# Patient Record
Sex: Female | Born: 1995 | Race: Black or African American | Hispanic: No | Marital: Single | Smoking: Current every day smoker
Health system: Southern US, Community
[De-identification: ages and names within clinical notes are randomized; demographics above are authoritative.]

## PROBLEM LIST (undated history)

## (undated) DIAGNOSIS — D649 Anemia, unspecified: Secondary | ICD-10-CM

## (undated) DIAGNOSIS — O139 Gestational [pregnancy-induced] hypertension without significant proteinuria, unspecified trimester: Secondary | ICD-10-CM

## (undated) DIAGNOSIS — E669 Obesity, unspecified: Secondary | ICD-10-CM

## (undated) DIAGNOSIS — K660 Peritoneal adhesions (postprocedural) (postinfection): Secondary | ICD-10-CM

## (undated) HISTORY — DX: Obesity, unspecified: E66.9

---

## 2013-10-09 DIAGNOSIS — O34219 Maternal care for unspecified type scar from previous cesarean delivery: Secondary | ICD-10-CM | POA: Insufficient documentation

## 2022-04-28 ENCOUNTER — Encounter (HOSPITAL_COMMUNITY): Payer: Self-pay | Admitting: Emergency Medicine

## 2022-04-28 ENCOUNTER — Ambulatory Visit (HOSPITAL_COMMUNITY)
Admission: EM | Admit: 2022-04-28 | Discharge: 2022-04-28 | Disposition: A | Discharge status: Home / Self Care | Payer: Medicaid Other | Attending: Physician Assistant | Admitting: Physician Assistant

## 2022-04-28 ENCOUNTER — Inpatient Hospital Stay (HOSPITAL_COMMUNITY)
Admission: AD | Admit: 2022-04-28 | Discharge: 2022-04-29 | Disposition: A | Discharge status: Home / Self Care | Payer: Medicaid Other | Attending: Obstetrics and Gynecology | Admitting: Obstetrics and Gynecology

## 2022-04-28 DIAGNOSIS — O3680X0 Pregnancy with inconclusive fetal viability, not applicable or unspecified: Secondary | ICD-10-CM | POA: Insufficient documentation

## 2022-04-28 DIAGNOSIS — Z3A01 Less than 8 weeks gestation of pregnancy: Secondary | ICD-10-CM

## 2022-04-28 DIAGNOSIS — M62830 Muscle spasm of back: Secondary | ICD-10-CM | POA: Diagnosis present

## 2022-04-28 DIAGNOSIS — O26891 Other specified pregnancy related conditions, first trimester: Secondary | ICD-10-CM | POA: Insufficient documentation

## 2022-04-28 DIAGNOSIS — S39012A Strain of muscle, fascia and tendon of lower back, initial encounter: Secondary | ICD-10-CM | POA: Diagnosis present

## 2022-04-28 DIAGNOSIS — M545 Low back pain, unspecified: Secondary | ICD-10-CM | POA: Diagnosis present

## 2022-04-28 DIAGNOSIS — M549 Dorsalgia, unspecified: Secondary | ICD-10-CM

## 2022-04-28 DIAGNOSIS — Z3A08 8 weeks gestation of pregnancy: Secondary | ICD-10-CM | POA: Insufficient documentation

## 2022-04-28 DIAGNOSIS — M546 Pain in thoracic spine: Secondary | ICD-10-CM | POA: Insufficient documentation

## 2022-04-28 DIAGNOSIS — Z6740 Type O blood, Rh positive: Secondary | ICD-10-CM | POA: Insufficient documentation

## 2022-04-28 DIAGNOSIS — R350 Frequency of micturition: Secondary | ICD-10-CM | POA: Diagnosis not present

## 2022-04-28 LAB — POCT URINALYSIS DIPSTICK, ED / UC
Bilirubin Urine: NEGATIVE
Glucose, UA: NEGATIVE mg/dL
Hgb urine dipstick: NEGATIVE
Ketones, ur: NEGATIVE mg/dL
Leukocytes,Ua: NEGATIVE
Nitrite: NEGATIVE
Protein, ur: NEGATIVE mg/dL
Specific Gravity, Urine: 1.015 (ref 1.005–1.030)
Urobilinogen, UA: 1 mg/dL (ref 0.0–1.0)
pH: 6.5 (ref 5.0–8.0)

## 2022-04-28 LAB — POC URINE PREG, ED: Preg Test, Ur: POSITIVE — AB

## 2022-04-28 MED ORDER — TIZANIDINE HCL 4 MG PO TABS: 4.0000 mg | ORAL_TABLET | Freq: Four times a day (QID) | ORAL | 0 refills | Status: DC | PRN

## 2022-04-28 MED ORDER — IBUPROFEN 800 MG PO TABS: 800.0000 mg | ORAL_TABLET | Freq: Three times a day (TID) | ORAL | 0 refills | Status: DC

## 2022-04-28 NOTE — Discharge Instructions (Addendum)
Advised to use ice therapy, 10 minutes on and 20 minutes off, 4-5 times a day to help reduce the pain in the lower back. Advised to take the ibuprofen 800 mg 1 every 8 hours with food to help reduce the pain Advised to take the Zanaflex 1 every 6-8 hours as needed for muscle spasm of the back. Follow-up PCP or return to urgent care if symptoms fail to improve.

## 2022-04-28 NOTE — ED Provider Notes (Signed)
[facility]    CSN: [id number] Arrival date & time: 04/28/22  1309      History   Chief Complaint Chief Complaint  Patient presents with   Back Pain    HPI Taylor Velazquez is a 26 y.o. female.   26 year old female presents with lower back pain.  Patient relates that she works at a [employer], she is on production.  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  Patient relates that after she started having this particular position she started having some lower back pain, it has been localized, worse with bending reaching and turning.  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  Patient relates she is also had frequency, but no dysuria, no hematuria.  Patient relates she has not taken any medication to get relief from the lower back pain.  Patient relates the pain does not travel down her legs, and she is not having weakness or numbness or tingling of the lower extremities. Patient requested a pregnancy test today, this is positive.  Patient has been counseled about taking the minimal amount of ibuprofen as possible to help give her relief, and to use more ice therapy.   Back Pain  History reviewed. No pertinent past medical history.  There are no problems to display for this patient.   Past Surgical History:  Procedure Laterality Date   CESAREAN SECTION  2014   Patient had another in 2015, 2016,2017,2019    OB History   No obstetric history on file.      Home Medications    Prior to Admission medications   Medication Sig Start Date End Date Taking? Authorizing Provider  diphenhydramine-acetaminophen (TYLENOL PM) 25-500 MG TABS tablet Take 1 tablet by mouth at bedtime as needed.   Yes [provider]  ibuprofen  (ADVIL) 800 MG tablet Take 1 tablet (800 mg total) by mouth 3 (three) times daily. 04/28/22  Yes Ellsworth LennoxJames, Tori Cupps, PA-C  tiZANidine (ZANAFLEX) 4 MG tablet Take 1 tablet (4 mg total) by mouth every 6 (six) hours as needed for muscle spasms. 04/28/22  Yes Ellsworth LennoxJames, Raeleigh Guinn, PA-C    Family History No family history on file.  Social History Social History   Tobacco Use   Smoking status: Never   Smokeless tobacco: Never  Vaping Use   Vaping Use: Never used  Substance Use Topics   Alcohol use: Never   Drug use: Never     Allergies   Patient has no known allergies.   Review of Systems Review of Systems  Genitourinary:  Positive for urgency.  Musculoskeletal:  Positive for back pain.    Physical Exam Triage Vital Signs ED Triage Vitals  Enc Vitals Group     BP 04/28/22 1427 124/81     Pulse Rate 04/28/22 1427 78     Resp 04/28/22 1427 16     Temp 04/28/22 1427 98.2 F (36.8 C)     Temp Source 04/28/22 1427 Oral     SpO2 04/28/22 1427 98 %     Weight 04/28/22 1431 236 lb (107 kg)     Height 04/28/22 1431 5\' 8"  (1.727 m)     Head Circumference --  Peak Flow --      Pain Score 04/28/22 1430 7     Pain Loc --      Pain Edu? --      Excl. in GC? --    No data found.  Updated Vital Signs BP 124/81 (BP Location: Right Arm)   Pulse 78   Temp 98.2 F (36.8 C) (Oral)   Resp 16   Ht 5\' 8"  (1.727 m)   Wt 236 lb (107 kg)   LMP 03/24/2022 (Approximate)   SpO2 98%   BMI 35.88 kg/m   Visual Acuity Right Eye Distance:   Left Eye Distance:   Bilateral Distance:    Right Eye Near:   Left Eye Near:    Bilateral Near:     Physical Exam Constitutional:      Appearance: Normal appearance.  Abdominal:     General: Abdomen is flat. Bowel sounds are normal.     Palpations: Abdomen is soft.     Tenderness: There is no abdominal tenderness.     Comments: Back: Tenderness palpated at the L5-S1 area, no swelling, negative straight leg raise bilaterally, strength is intact  bilaterally.  Range of motion is normal without restriction.  Neurological:     Mental Status: She is alert.     UC Treatments / Results  Labs (all labs ordered are listed, but only abnormal results are displayed) Labs Reviewed  POC URINE PREG, ED - Abnormal; Notable for the following components:      Result Value   Preg Test, Ur POSITIVE (*)    All other components within normal limits  URINE CULTURE  POCT URINALYSIS DIPSTICK, ED / UC    EKG   Radiology No results found.  Procedures Procedures (including critical care time)  Medications Ordered in UC Medications - No data to display  Initial Impression / Assessment and Plan / UC Course  I have reviewed the triage vital signs and the nursing notes.  Pertinent labs & imaging results that were available during my care of the patient were reviewed by me and considered in my medical decision making (see chart for details).    Plan: 1.  Patient advised to use ice therapy to help reduce the pain to the back, 10 minutes on 20 minutes off, 3-4 times a day. 2.  Patient advised to take minimal amount of ibuprofen to help reduce the pain of the lower back. 3.  Patient advised to follow-up with PCP if symptoms fail to improve. 4.  Patient advised to follow-up with her women's health provider since her pregnancy test was positive today. Final Clinical Impressions(s) / UC Diagnoses   Final diagnoses:  Acute midline low back pain without sciatica  Strain of lumbar region, initial encounter  Muscle spasm of back  Frequency of urination     Discharge Instructions      Advised to use ice therapy, 10 minutes on and 20 minutes off, 4-5 times a day to help reduce the pain in the lower back. Advised to take the ibuprofen 800 mg 1 every 8 hours with food to help reduce the pain Advised to take the Zanaflex 1 every 6-8 hours as needed for muscle spasm of the back. Follow-up PCP or return to urgent care if symptoms fail to  improve.    ED Prescriptions     Medication Sig Dispense Auth. Provider   ibuprofen (ADVIL) 800 MG tablet Take 1 tablet (800 mg total) by mouth 3 (three) times daily. 21 tablet  Ellsworth Lennox, PA-C   tiZANidine (ZANAFLEX) 4 MG tablet Take 1 tablet (4 mg total) by mouth every 6 (six) hours as needed for muscle spasms. 30 tablet Ellsworth Lennox, PA-C      PDMP not reviewed this encounter.   Ellsworth Lennox, PA-C 04/28/22 1504

## 2022-04-28 NOTE — ED Triage Notes (Signed)
Patient c/o LFT lower back pain and urinary frequency x 1 week.   Patient denies fall or trauma.   Patient denies dysuria or ABD pain.   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   Patient endorses pain occurs randomly.   Patient hasn't taken any medications for symptoms.

## 2022-04-29 ENCOUNTER — Inpatient Hospital Stay (HOSPITAL_COMMUNITY): Payer: Medicaid Other

## 2022-04-29 ENCOUNTER — Encounter (HOSPITAL_COMMUNITY): Payer: Self-pay | Admitting: *Deleted

## 2022-04-29 DIAGNOSIS — M549 Dorsalgia, unspecified: Secondary | ICD-10-CM

## 2022-04-29 DIAGNOSIS — O99891 Other specified diseases and conditions complicating pregnancy: Secondary | ICD-10-CM | POA: Diagnosis not present

## 2022-04-29 DIAGNOSIS — M62830 Muscle spasm of back: Secondary | ICD-10-CM | POA: Diagnosis not present

## 2022-04-29 DIAGNOSIS — Z3A01 Less than 8 weeks gestation of pregnancy: Secondary | ICD-10-CM

## 2022-04-29 DIAGNOSIS — M545 Low back pain, unspecified: Secondary | ICD-10-CM | POA: Diagnosis not present

## 2022-04-29 DIAGNOSIS — M546 Pain in thoracic spine: Secondary | ICD-10-CM | POA: Diagnosis not present

## 2022-04-29 DIAGNOSIS — O3680X0 Pregnancy with inconclusive fetal viability, not applicable or unspecified: Secondary | ICD-10-CM | POA: Diagnosis not present

## 2022-04-29 DIAGNOSIS — Z6740 Type O blood, Rh positive: Secondary | ICD-10-CM | POA: Diagnosis not present

## 2022-04-29 DIAGNOSIS — Z3A08 8 weeks gestation of pregnancy: Secondary | ICD-10-CM | POA: Diagnosis not present

## 2022-04-29 DIAGNOSIS — O26891 Other specified pregnancy related conditions, first trimester: Secondary | ICD-10-CM | POA: Diagnosis not present

## 2022-04-29 LAB — URINE CULTURE: Culture: <10000 — AB

## 2022-04-29 LAB — ABO/RH: ABO/RH(D): O POS

## 2022-04-29 LAB — URINALYSIS, ROUTINE W REFLEX MICROSCOPIC
Bacteria, UA: NONE SEEN
Bilirubin Urine: NEGATIVE
Glucose, UA: NEGATIVE mg/dL
Hgb urine dipstick: NEGATIVE
Ketones, ur: 80 mg/dL — AB
Leukocytes,Ua: NEGATIVE
Nitrite: NEGATIVE
Protein, ur: NEGATIVE mg/dL
Specific Gravity, Urine: 1.027 (ref 1.005–1.030)
pH: 6 (ref 5.0–8.0)

## 2022-04-29 LAB — WET PREP, GENITAL
Clue Cells Wet Prep HPF POC: NONE SEEN
Sperm: NONE SEEN
Trich, Wet Prep: NONE SEEN
WBC, Wet Prep HPF POC: <10 (ref <10)
Yeast Wet Prep HPF POC: NONE SEEN

## 2022-04-29 LAB — GC/CHLAMYDIA PROBE AMP (~~LOC~~) NOT AT ARMC
Chlamydia: NEGATIVE
Comment: NEGATIVE
Comment: NORMAL
Neisseria Gonorrhea: NEGATIVE

## 2022-04-29 LAB — CBC
HCT: 35.1 % — ABNORMAL LOW (ref 36.0–46.0)
Hemoglobin: 11.4 g/dL — ABNORMAL LOW (ref 12.0–15.0)
MCH: 27.3 pg (ref 26.0–34.0)
MCHC: 32.5 g/dL (ref 30.0–36.0)
MCV: 84.2 fL (ref 80.0–100.0)
Platelets: 170 10*3/uL (ref 150–400)
RBC: 4.17 MIL/uL (ref 3.87–5.11)
RDW: 13.7 % (ref 11.5–15.5)
WBC: 9.6 10*3/uL (ref 4.0–10.5)
nRBC: 0 % (ref 0.0–0.2)

## 2022-04-29 LAB — HCG, QUANTITATIVE, PREGNANCY: hCG, Beta Chain, Quant, S: 1236 m[IU]/mL — ABNORMAL HIGH (ref <5)

## 2022-04-29 MED ORDER — CYCLOBENZAPRINE HCL 10 MG PO TABS: 10.0000 mg | ORAL_TABLET | Freq: Two times a day (BID) | ORAL | 0 refills | Status: DC | PRN

## 2022-04-29 MED ORDER — CYCLOBENZAPRINE HCL 5 MG PO TABS
10.0000 mg | ORAL_TABLET | Freq: Once | ORAL | Status: AC
  Administered 2022-04-29: 10 mg via ORAL
  Filled 2022-04-29: qty 2

## 2022-04-29 NOTE — MAU Provider Note (Addendum)
Chief Complaint: No chief complaint on file.   Event Date/Time   First Provider Initiated Contact with Patient 04/29/22 0215        SUBJECTIVE HPI: Taylor Velazquez is a 26 y.o. G6P5006 at 4461w1d by LMP who presents to maternity admissions reporting recurrent pain in midback and between shoulders.  Was told to take ibuprofen but it does'nt help much.  Also is early pregnant and was told she needed evaluation.. She denies vaginal bleeding, vaginal itching/burning, urinary symptoms, h/a, dizziness, n/v, or fever/chills.    Back Pain This is a new problem. The current episode started in the past 7 days. The problem occurs constantly. The problem is unchanged. The pain is present in the thoracic spine and lumbar spine. The quality of the pain is described as aching and cramping. The pain does not radiate. The pain is moderate. The pain is The same all the time. The symptoms are aggravated by bending, standing and twisting. Stiffness is present All day. Pertinent negatives include no abdominal pain, dysuria, fever, headaches or numbness. She has tried analgesics for the symptoms. The treatment provided mild relief.   No past medical history on file. Past Surgical History:  Procedure Laterality Date   CESAREAN SECTION  2014   Patient had another in 2015, 2016,2017,2019   Social History   Socioeconomic History   Marital status: Single    Spouse name: Not on file   Number of children: Not on file   Years of education: Not on file   Highest education level: Not on file  Occupational History   Not on file  Tobacco Use   Smoking status: Never   Smokeless tobacco: Never  Vaping Use   Vaping Use: Never used  Substance and Sexual Activity   Alcohol use: Never   Drug use: Never   Sexual activity: Not on file  Other Topics Concern   Not on file  Social History Narrative   Not on file   Social Determinants of Health   Financial Resource Strain: Not on file  Food Insecurity: Not on file   Transportation Needs: Not on file  Physical Activity: Not on file  Stress: Not on file  Social Connections: Not on file  Intimate Partner Violence: Not on file   No current facility-administered medications on file prior to encounter.   Current Outpatient Medications on File Prior to Encounter  Medication Sig Dispense Refill   diphenhydramine-acetaminophen (TYLENOL PM) 25-500 MG TABS tablet Take 1 tablet by mouth at bedtime as needed.     ibuprofen (ADVIL) 800 MG tablet Take 1 tablet (800 mg total) by mouth 3 (three) times daily. 21 tablet 0   tiZANidine (ZANAFLEX) 4 MG tablet Take 1 tablet (4 mg total) by mouth every 6 (six) hours as needed for muscle spasms. 30 tablet 0   No Known Allergies  I have reviewed patient's Past Medical Hx, Surgical Hx, Family Hx, Social Hx, medications and allergies.   ROS:  Review of Systems  Constitutional:  Negative for fever.  Gastrointestinal:  Negative for abdominal pain.  Genitourinary:  Negative for dysuria.  Musculoskeletal:  Positive for back pain.  Neurological:  Negative for numbness and headaches.  Review of Systems  Other systems negative   Physical Exam  Physical Exam Patient Vitals for the past 24 hrs:  BP Temp Temp src Pulse Resp Height Weight  04/29/22 0144 110/74 (!) 97.5 F (36.4 C) Oral 84 20 5\' 8"  (1.727 m) 108.5 kg   Constitutional: Well-developed, well-nourished female  in no acute distress.  Cardiovascular: normal rate Respiratory: normal effort GI: Abd soft, non-tender.  MS: Extremities nontender, no edema, normal ROM   Tender over lumbar spin and between shoulders Neurologic: Alert and oriented x 4.  GU: Neg CVAT.  PELVIC EXAM: deferred in lieu of transvaginal ultrasound  LAB RESULTS Results for orders placed or performed during the hospital encounter of 04/28/22 (from the past 24 hour(s))  Urinalysis, Routine w reflex microscopic     Status: Abnormal   Collection Time: 04/29/22  1:18 AM  Result Value Ref  Range   Color, Urine YELLOW YELLOW   APPearance CLEAR CLEAR   Specific Gravity, Urine 1.027 1.005 - 1.030   pH 6.0 5.0 - 8.0   Glucose, UA NEGATIVE NEGATIVE mg/dL   Hgb urine dipstick NEGATIVE NEGATIVE   Bilirubin Urine NEGATIVE NEGATIVE   Ketones, ur 80 (A) NEGATIVE mg/dL   Protein, ur NEGATIVE NEGATIVE mg/dL   Nitrite NEGATIVE NEGATIVE   Leukocytes,Ua NEGATIVE NEGATIVE   RBC / HPF 0-5 0 - 5 RBC/hpf   WBC, UA 0-5 0 - 5 WBC/hpf   Bacteria, UA NONE SEEN NONE SEEN   Squamous Epithelial / LPF 0-5 0 - 5   Mucus PRESENT   CBC     Status: Abnormal   Collection Time: 04/29/22  1:21 AM  Result Value Ref Range   WBC 9.6 4.0 - 10.5 K/uL   RBC 4.17 3.87 - 5.11 MIL/uL   Hemoglobin 11.4 (L) 12.0 - 15.0 g/dL   HCT 92.1 (L) 19.4 - 17.4 %   MCV 84.2 80.0 - 100.0 fL   MCH 27.3 26.0 - 34.0 pg   MCHC 32.5 30.0 - 36.0 g/dL   RDW 08.1 44.8 - 18.5 %   Platelets 170 150 - 400 K/uL   nRBC 0.0 0.0 - 0.2 %  hCG, quantitative, pregnancy     Status: Abnormal   Collection Time: 04/29/22  1:21 AM  Result Value Ref Range   hCG, Beta Chain, Quant, S 1,236 (H) <5 mIU/mL  ABO/Rh     Status: None   Collection Time: 04/29/22  1:21 AM  Result Value Ref Range   ABO/RH(D) O POS    No rh immune globuloin      NOT A RH IMMUNE GLOBULIN CANDIDATE, PT RH POSITIVE Performed at [facility], [address] [place], [place] [address]   Wet prep, genital     Status: None   Collection Time: 04/29/22  1:50 AM  Result Value Ref Range   Yeast Wet Prep HPF POC NONE SEEN NONE SEEN   Trich, Wet Prep NONE SEEN NONE SEEN   Clue Cells Wet Prep HPF POC NONE SEEN NONE SEEN   WBC, Wet Prep HPF POC <10 <10   Sperm NONE SEEN      --/--/O POS (06/02 0121)  IMAGING US OB LESS THAN 14 WEEKS WITH OB TRANSVAGINAL  Result Date: 04/29/2022 CLINICAL DATA:  Pain. EXAM: OBSTETRIC <14 WK [place] AND TRANSVAGINAL OB US TECHNIQUE: Both transabdominal and transvaginal ultrasound examinations were performed for complete evaluation of  the gestation as well as the maternal uterus, adnexal regions, and pelvic cul-de-sac. Transvaginal technique was performed to assess early pregnancy. COMPARISON:  None Available. FINDINGS: Intrauterine gestational sac: None Endometrium: Not well evaluated or visualized secondary to artifact. Right ovary: Within normal limits measuring 2.0 by 2.8 x 1.4 cm. Left ovary: Measures 3.7 x 2.2 by 2.0 cm. Likely corpus luteum noted in the left ovary measuring 2.1 x  2.2 by 1.7 cm. Other: Trace free fluid in the pelvis. IMPRESSION: 1. No intrauterine gestational sac identified. Endometrium is not well visualized on this study. No adnexal masses. Likely corpus luteum in the left ovary. In the setting of a positive pregnancy test, findings may be related to early normal IUP, completed abortion/abortion in progress or occult ectopic pregnancy. Recommend correlation with serial beta HCG. Electronically Signed   By: Darliss Cheney M.D.   On: 04/29/2022 02:50     MAU Management/MDM: I have reviewed the triage vital signs and the nursing notes.   Pertinent labs & imaging results that were available during my care of the patient were reviewed by me and considered in my medical decision making (see chart for details).      I have reviewed her medical records including past results, notes and treatments.   Ordered usual first trimester r/o ectopic labs.   Pelvic cultures done Will check baseline Ultrasound to rule out ectopic.  Treatments in MAU included Flexeril which completely alleviated pain.   This bleeding/pain can represent a normal pregnancy with bleeding, spontaneous abortion or even an ectopic which can be life-threatening.  The process as listed above helps to determine which of these is present.    ASSESSMENT Pregnancy at [redacted]w[redacted]d by LMP Back pain due to spasm Pregnancy of unknown location  PLAN Discharge home Rx Flexeril for back spasm Plan to rpeat HCG on Sunday morning Plan to repeat Ultrasound in  about 7-10 days if HCG levels double appropriately  Ectopic precautions   Pt stable at time of discharge. Encouraged to return here if she develops worsening of symptoms, increase in pain, fever, or other concerning symptoms.    Wynelle Bourgeois CNM, MSN Certified Nurse-Midwife 04/29/2022  2:16 AM

## 2022-04-29 NOTE — MAU Note (Signed)
Pt says her back hurts - started 04-16-2022- took XS Tyl- helped some  Has continued . Went to Urgent Care bc- had sharp pain in her back- found out preg. Had  sex- 3 days ago

## 2022-05-01 ENCOUNTER — Inpatient Hospital Stay (HOSPITAL_COMMUNITY)
Admission: AD | Admit: 2022-05-01 | Discharge: 2022-05-01 | Disposition: A | Discharge status: Home / Self Care | Payer: Medicaid Other | Attending: Obstetrics and Gynecology | Admitting: Obstetrics and Gynecology

## 2022-05-01 ENCOUNTER — Other Ambulatory Visit: Payer: Self-pay

## 2022-05-01 ENCOUNTER — Ambulatory Visit (HOSPITAL_COMMUNITY)
Admit: 2022-05-01 | Discharge: 2022-05-01 | Disposition: A | Discharge status: Home / Self Care | Payer: Medicaid Other | Attending: Obstetrics and Gynecology | Admitting: Obstetrics and Gynecology

## 2022-05-01 DIAGNOSIS — Z3490 Encounter for supervision of normal pregnancy, unspecified, unspecified trimester: Secondary | ICD-10-CM | POA: Diagnosis not present

## 2022-05-01 DIAGNOSIS — Z3A01 Less than 8 weeks gestation of pregnancy: Secondary | ICD-10-CM

## 2022-05-01 DIAGNOSIS — Z3491 Encounter for supervision of normal pregnancy, unspecified, first trimester: Secondary | ICD-10-CM | POA: Insufficient documentation

## 2022-05-01 LAB — HCG, QUANTITATIVE, PREGNANCY: hCG, Beta Chain, Quant, S: 3449 m[IU]/mL — ABNORMAL HIGH (ref <5)

## 2022-05-01 NOTE — Discharge Instructions (Signed)
           [facility]  [address] [phone]

## 2022-05-01 NOTE — MAU Provider Note (Signed)
  S Ms. Zayden Hahne is a 26 y.o. [phone] patient who presents to MAU today for Beta Hcg follow up.    O BP 121/72 (BP Location: Right Arm)   Pulse 98   Temp 99.2 F (37.3 C) (Oral)   Resp 16   LMP 04/02/2022   SpO2 97% Comment: room air Physical Exam Vitals and nursing note reviewed.  Constitutional:      General: She is not in acute distress. HENT:     Head: Normocephalic.     Nose: Nose normal.  Pulmonary:     Effort: Pulmonary effort is normal. No respiratory distress.  Musculoskeletal:     Cervical back: Normal range of motion.  Neurological:     Mental Status: She is alert and oriented to person, place, and time.  Psychiatric:        Mood and Affect: Mood normal.        Component Ref Range & Units 08:10 2 d ago  hCG, Beta Chain, Quant, S <5 mIU/mL 3,449 High   1,236 High  CM       A Medical screening exam complete Beta Quant rising appropriately for 4 week early pregnancy.   P  Follow-up Information     [facility] Follow up in 2 day(s).   Specialty: Obstetrics and Gynecology Why: lab follow up Contact information: [address] [place] [address] [phone]              CNM scheduled 1 additional Quant Follow up in 3 days at [facility].  Patient Discharge from MAU in stable condition.  Address of office for follow-up given.  Warning signs for worsening condition that would warrant emergency follow-up discussed Patient may return to MAU as needed   Carlynn Herald, [place] 05/01/2022 10:42 AM

## 2022-05-01 NOTE — MAU Note (Signed)
Taylor Velazquez is a 26 y.o. at [redacted]w[redacted]d here in MAU reporting: here for follow up hcg. Denies pain and bleeding.  Onset of complaint: ongoing  Pain score: 0/10  Vitals:   05/01/22 0806  BP: 121/72  Pulse: 98  Resp: 16  Temp: 99.2 F (37.3 C)  SpO2: 97%     Lab orders placed from triage: hcg

## 2022-05-04 ENCOUNTER — Ambulatory Visit: Payer: Medicaid Other

## 2022-05-05 ENCOUNTER — Other Ambulatory Visit: Payer: Medicaid Other

## 2022-05-07 ENCOUNTER — Inpatient Hospital Stay (HOSPITAL_COMMUNITY)
Admission: AD | Admit: 2022-05-07 | Discharge: 2022-05-07 | Disposition: A | Discharge status: Home / Self Care | Payer: Medicaid Other | Attending: Obstetrics and Gynecology | Admitting: Obstetrics and Gynecology

## 2022-05-07 ENCOUNTER — Encounter (HOSPITAL_COMMUNITY): Payer: Self-pay | Admitting: Obstetrics and Gynecology

## 2022-05-07 DIAGNOSIS — O3680X0 Pregnancy with inconclusive fetal viability, not applicable or unspecified: Secondary | ICD-10-CM | POA: Diagnosis not present

## 2022-05-07 DIAGNOSIS — Z3A01 Less than 8 weeks gestation of pregnancy: Secondary | ICD-10-CM | POA: Diagnosis not present

## 2022-05-07 DIAGNOSIS — Z3201 Encounter for pregnancy test, result positive: Secondary | ICD-10-CM | POA: Diagnosis present

## 2022-05-07 HISTORY — DX: Gestational (pregnancy-induced) hypertension without significant proteinuria, unspecified trimester: O13.9

## 2022-05-07 LAB — HCG, QUANTITATIVE, PREGNANCY: hCG, Beta Chain, Quant, S: 20009 m[IU]/mL — ABNORMAL HIGH (ref <5)

## 2022-05-07 NOTE — MAU Note (Signed)
Judeth Horn NP in [facility] RM to see pt and discuss plan of care. Lab drawn and then pt d/c home. NP will call pt with results and further discuss plan of care depending on lab results. Pt agrees with plan and d/c ambulatory

## 2022-05-07 NOTE — MAU Provider Note (Signed)
History   Chief Complaint:  Follow-up   Taylor Velazquez is  26 y.o. [id number] Patient's last menstrual period was 04/02/2022.Taylor Velazquez Patient is here for follow up of quantitative HCG and ongoing surveillance of pregnancy status. She is [redacted]w[redacted]d weeks gestation  by LMP.   She was supposed to go to the office on 6/7 for repeat HCG but couldn't keep the appointment due to her work schedule.   Since her last visit, the patient is without new complaint. The patient reports bleeding as  none now.  She denies any pain.  General ROS:  negative  Her previous Quantitative HCG values are:  Component     Latest Ref Rng 04/29/2022 05/01/2022  HCG, Beta Chain, Quant, S     <5 mIU/mL 1,236 (H)  3,449 (H)        Physical Exam   Blood pressure 123/86, pulse 81, temperature 98.2 F (36.8 C), resp. rate 17, height 5\' 8"  (1.727 m), weight 109.3 kg, last menstrual period 04/02/2022, SpO2 100 %.  Physical Examination: General appearance - alert, well appearing, and in no distress Mental status - normal mood, behavior, speech, dress, motor activity, and thought processes Eyes - sclera anicteric Chest - normal respiratory effort  Labs: Results for orders placed or performed during the hospital encounter of 05/07/22 (from the past 24 hour(s))  hCG, quantitative, pregnancy   Collection Time: 05/07/22  5:20 AM  Result Value Ref Range   hCG, Beta Chain, Quant, S 20,009 (H) <5 mIU/mL    Ultrasound Studies:   No results found.  Assessment:   1. Pregnancy of unknown anatomic location   2. [redacted] weeks gestation of pregnancy     -HCG continues to rise. Currently no symptoms. Will schedule for f/u ultrasound.   Plan: -Discharge home in stable condition -SAB precautions discussed -Patient advised to follow-up with [facility] for ultrasound on 6/20 -Patient may return to MAU as needed or if her condition were to change or worsen  7/20, NP 05/07/2022, 9:18 AM

## 2022-05-07 NOTE — MAU Note (Addendum)
.  Taylor Velazquez is a 26 y.o. at 101w0d here in MAU for repeat BHCG. States she has just got off work so has difficulty making some appts. Denies any bleeding. Occ sharp pains in abdomen but no pain currently. LMP: 04/02/22 Onset of complaint: n/a Pain score: 0 Vitals:   05/07/22 0440 05/07/22 0441  BP:  123/86  Pulse: 81   Resp: 17   Temp: 98.2 F (36.8 C)   SpO2: 100%      FHT:n/a Lab orders placed from triage:

## 2022-05-07 NOTE — Discharge Instructions (Signed)
Return to care  If you have heavier bleeding that soaks through more than 2 pads per hour for an hour or more If you bleed so much that you feel like you might pass out or you do pass out If you have significant abdominal pain that is not improved with Tylenol   

## 2022-05-10 ENCOUNTER — Other Ambulatory Visit: Payer: Self-pay

## 2022-05-10 ENCOUNTER — Inpatient Hospital Stay (HOSPITAL_COMMUNITY)
Admission: AD | Admit: 2022-05-10 | Discharge: 2022-05-11 | Disposition: A | Discharge status: Home / Self Care | Payer: Medicaid Other | Attending: Obstetrics & Gynecology | Admitting: Obstetrics & Gynecology

## 2022-05-10 DIAGNOSIS — R109 Unspecified abdominal pain: Secondary | ICD-10-CM | POA: Insufficient documentation

## 2022-05-10 DIAGNOSIS — Z3491 Encounter for supervision of normal pregnancy, unspecified, first trimester: Secondary | ICD-10-CM

## 2022-05-10 DIAGNOSIS — O0941 Supervision of pregnancy with grand multiparity, first trimester: Secondary | ICD-10-CM | POA: Insufficient documentation

## 2022-05-10 DIAGNOSIS — O26891 Other specified pregnancy related conditions, first trimester: Secondary | ICD-10-CM

## 2022-05-10 DIAGNOSIS — O09291 Supervision of pregnancy with other poor reproductive or obstetric history, first trimester: Secondary | ICD-10-CM | POA: Insufficient documentation

## 2022-05-10 DIAGNOSIS — Z3A01 Less than 8 weeks gestation of pregnancy: Secondary | ICD-10-CM | POA: Insufficient documentation

## 2022-05-10 DIAGNOSIS — O26851 Spotting complicating pregnancy, first trimester: Secondary | ICD-10-CM | POA: Insufficient documentation

## 2022-05-10 LAB — URINALYSIS, ROUTINE W REFLEX MICROSCOPIC
Bilirubin Urine: NEGATIVE
Glucose, UA: NEGATIVE mg/dL
Hgb urine dipstick: NEGATIVE
Ketones, ur: NEGATIVE mg/dL
Leukocytes,Ua: NEGATIVE
Nitrite: NEGATIVE
Protein, ur: NEGATIVE mg/dL
Specific Gravity, Urine: 1.027 (ref 1.005–1.030)
pH: 5 (ref 5.0–8.0)

## 2022-05-10 NOTE — MAU Note (Signed)
.  Taylor Velazquez is a 26 y.o. at [redacted]w[redacted]d here in MAU reporting I feel dehydrated and weak but I drink a lot of water. Pink on tissue last night and today when I wiped. Having some cramping in lower abd. Having normal BMs.   Onset of complaint: yesterday Pain score: 6 Vitals:   05/10/22 2251 05/10/22 2253  BP:  113/86  Pulse: 82   Resp: 17   Temp: 98.6 F (37 C)   SpO2: 99%      FHT:na Lab orders placed from triage:   ua

## 2022-05-11 ENCOUNTER — Inpatient Hospital Stay (HOSPITAL_COMMUNITY): Payer: Medicaid Other

## 2022-05-11 DIAGNOSIS — O26851 Spotting complicating pregnancy, first trimester: Secondary | ICD-10-CM

## 2022-05-11 DIAGNOSIS — O09291 Supervision of pregnancy with other poor reproductive or obstetric history, first trimester: Secondary | ICD-10-CM | POA: Diagnosis not present

## 2022-05-11 DIAGNOSIS — R109 Unspecified abdominal pain: Secondary | ICD-10-CM

## 2022-05-11 DIAGNOSIS — O26891 Other specified pregnancy related conditions, first trimester: Secondary | ICD-10-CM

## 2022-05-11 DIAGNOSIS — Z3A01 Less than 8 weeks gestation of pregnancy: Secondary | ICD-10-CM | POA: Diagnosis not present

## 2022-05-11 DIAGNOSIS — Z3491 Encounter for supervision of normal pregnancy, unspecified, first trimester: Secondary | ICD-10-CM | POA: Diagnosis not present

## 2022-05-11 DIAGNOSIS — O0941 Supervision of pregnancy with grand multiparity, first trimester: Secondary | ICD-10-CM | POA: Diagnosis not present

## 2022-05-11 DIAGNOSIS — R1030 Lower abdominal pain, unspecified: Secondary | ICD-10-CM | POA: Diagnosis present

## 2022-05-11 NOTE — MAU Note (Signed)
Lisa Leftwich-Kirby CNM in Triage to see pt and discuss plan of care 

## 2022-05-11 NOTE — MAU Provider Note (Signed)
Chief Complaint: Abdominal Pain and Vaginal Bleeding   Event Date/Time   First Provider Initiated Contact with Patient 05/11/22 0146      SUBJECTIVE HPI: Taylor Velazquez is a 26 y.o. G6P5005 at [redacted]w[redacted]d by LMP who presents to maternity admissions reporting sharp intermittent lower abdominal pain and pink spotting x 2 days. She denies any other symptoms.   She initially presented to MAU on 04/29/22  with back pain and had appropriate hcg rise on 05/01/22, which was repeated on MAU visit 05/07/22 with continuing hcg rise.  She has outpatient [place] ordered on 05/17/22 for follow up.  HPI  Past Medical History:  Diagnosis Date   Gestational hypertension    Past Surgical History:  Procedure Laterality Date   CESAREAN SECTION  2014   Patient had another in 2015, 2016,2017,2019   Social History   Socioeconomic History   Marital status: Single    Spouse name: Not on file   Number of children: Not on file   Years of education: Not on file   Highest education level: Not on file  Occupational History   Not on file  Tobacco Use   Smoking status: Never   Smokeless tobacco: Never  Vaping Use   Vaping Use: Never used  Substance and Sexual Activity   Alcohol use: Never   Drug use: Never   Sexual activity: Not on file  Other Topics Concern   Not on file  Social History Narrative   Not on file   Social Determinants of Health   Financial Resource Strain: Not on file  Food Insecurity: Not on file  Transportation Needs: Not on file  Physical Activity: Not on file  Stress: Not on file  Social Connections: Not on file  Intimate Partner Violence: Not on file   No current facility-administered medications on file prior to encounter.   Current Outpatient Medications on File Prior to Encounter  Medication Sig Dispense Refill   cyclobenzaprine (FLEXERIL) 10 MG tablet Take 1 tablet (10 mg total) by mouth 2 (two) times daily as needed for muscle spasms. 20 tablet 0   diphenhydramine-acetaminophen  (TYLENOL PM) 25-500 MG TABS tablet Take 1 tablet by mouth at bedtime as needed.     EPINEPHrine 0.3 mg/0.3 mL IJ SOAJ injection INJECT 0.3 ML IN THE MUSCLE ONCE AS NEEDED FOR ANAPHYLAXIS FOR UP TO 1 DOSE     ipratropium (ATROVENT) 0.03 % nasal spray Place into the nose.     No Known Allergies  ROS:  Review of Systems  Constitutional:  Negative for chills, fatigue and fever.  Respiratory:  Negative for shortness of breath.   Cardiovascular:  Negative for chest pain.  Gastrointestinal:  Positive for abdominal pain.  Genitourinary:  Positive for vaginal bleeding. Negative for difficulty urinating, dysuria, flank pain, pelvic pain, vaginal discharge and vaginal pain.  Neurological:  Negative for dizziness and headaches.  Psychiatric/Behavioral: Negative.       I have reviewed patient's Past Medical Hx, Surgical Hx, Family Hx, Social Hx, medications and allergies.   Physical Exam  Patient Vitals for the past 24 hrs:  BP Temp Pulse Resp SpO2 Height Weight  05/10/22 2253 113/86 -- -- -- -- -- --  05/10/22 2251 -- 98.6 F (37 C) 82 17 99 % 5\' 8"  (1.727 m) 108.9 kg   Constitutional: Well-developed, well-nourished female in no acute distress.  Cardiovascular: normal rate Respiratory: normal effort GI: Abd soft, non-tender. Pos BS x 4 MS: Extremities nontender, no edema, normal ROM Neurologic: Alert and  oriented x 4.  GU: Neg CVAT.  PELVIC EXAM: deferred   LAB RESULTS Results for orders placed or performed during the hospital encounter of 05/10/22 (from the past 24 hour(s))  Urinalysis, Routine w reflex microscopic Urine, Clean Catch     Status: Abnormal   Collection Time: 05/10/22 11:00 PM  Result Value Ref Range   Color, Urine YELLOW YELLOW   APPearance HAZY (A) CLEAR   Specific Gravity, Urine 1.027 1.005 - 1.030   pH 5.0 5.0 - 8.0   Glucose, UA NEGATIVE NEGATIVE mg/dL   Hgb urine dipstick NEGATIVE NEGATIVE   Bilirubin Urine NEGATIVE NEGATIVE   Ketones, ur NEGATIVE NEGATIVE  mg/dL   Protein, ur NEGATIVE NEGATIVE mg/dL   Nitrite NEGATIVE NEGATIVE   Leukocytes,Ua NEGATIVE NEGATIVE    --/--/O POS (06/02 0121)  IMAGING US OB Transvaginal  Result Date: 05/11/2022 CLINICAL DATA:  Pregnant with abdominal pain and spotting since last night. EXAM: TRANSVAGINAL OB ULTRASOUND TECHNIQUE: Transvaginal ultrasound was performed for complete evaluation of the gestation as well as the maternal uterus, adnexal regions, and pelvic cul-de-sac. COMPARISON:  04/29/2022 exam with no IUP visible. FINDINGS: Intrauterine gestational sac: Single. Yolk sac:  Visualized. Embryo:  Visualized. Cardiac Activity: Visualized. Heart Rate: 127 bpm MSD: Fetal pole visible.  Not measured. CRL:   7.6 mm   6 w 5 d +/-4 days; [place] EDC: 12/30/2022 Subchorionic hemorrhage:  None visualized. Maternal uterus/adnexae: The ovaries are sonographically unremarkable and normal in size. No adnexal free fluid or mass is seen. The uterus is anteverted and slightly retroflexed. The cervix is closed, measures 4.1 cm in length and there are a few tiny nabothian cysts. No uterine wall mass is seen. IMPRESSION: 1. Living single IUP measuring 6 weeks 5 days, ultrasound EDC 12/30/2022. 2. No subchorionic hemorrhage or other acute sonographic findings. Electronically Signed   By: Telford Nab M.D.   On: 05/11/2022 02:08   US OB LESS THAN 14 WEEKS WITH OB TRANSVAGINAL  Result Date: 04/29/2022 CLINICAL DATA:  Pain. EXAM: OBSTETRIC <14 WK [place] AND TRANSVAGINAL OB US TECHNIQUE: Both transabdominal and transvaginal ultrasound examinations were performed for complete evaluation of the gestation as well as the maternal uterus, adnexal regions, and pelvic cul-de-sac. Transvaginal technique was performed to assess early pregnancy. COMPARISON:  None Available. FINDINGS: Intrauterine gestational sac: None Endometrium: Not well evaluated or visualized secondary to artifact. Right ovary: Within normal limits measuring 2.0 by 2.8 x 1.4 cm. Left  ovary: Measures 3.7 x 2.2 by 2.0 cm. Likely corpus luteum noted in the left ovary measuring 2.1 x 2.2 by 1.7 cm. Other: Trace free fluid in the pelvis. IMPRESSION: 1. No intrauterine gestational sac identified. Endometrium is not well visualized on this study. No adnexal masses. Likely corpus luteum in the left ovary. In the setting of a positive pregnancy test, findings may be related to early normal IUP, completed abortion/abortion in progress or occult ectopic pregnancy. Recommend correlation with serial beta HCG. Electronically Signed   By: Ronney Asters M.D.   On: 04/29/2022 02:50    MAU Management/MDM: Orders Placed This Encounter  Procedures   US OB Transvaginal   Urinalysis, Routine w reflex microscopic Urine, Clean Catch   Discharge patient    No orders of the defined types were placed in this encounter.   IUP confirmed on today's [place], no cause for bleeding seen.  Pt with minimal bleeding in MAU and normal vaginal cultures 2 weeks ago.  Pt to f/u with early prenatal care, return to MAU  as needed for emergencies.  Message sent to cancel outpatient [place] on 05/17/22.    ASSESSMENT 1. Spotting affecting pregnancy in first trimester   2. Normal IUP (intrauterine pregnancy) on prenatal ultrasound, first trimester   3. Abdominal pain during pregnancy in first trimester   4. [redacted] weeks gestation of pregnancy     PLAN Discharge home Allergies as of 05/11/2022   No Known Allergies      Medication List     TAKE these medications    cyclobenzaprine 10 MG tablet Commonly known as: FLEXERIL Take 1 tablet (10 mg total) by mouth 2 (two) times daily as needed for muscle spasms.   diphenhydramine-acetaminophen 25-500 MG Tabs tablet Commonly known as: TYLENOL PM Take 1 tablet by mouth at bedtime as needed.   EPINEPHrine 0.3 mg/0.3 mL Soaj injection Commonly known as: EPI-PEN INJECT 0.3 ML IN THE MUSCLE ONCE AS NEEDED FOR ANAPHYLAXIS FOR UP TO 1 DOSE   ipratropium 0.03 % nasal  spray Commonly known as: ATROVENT Place into the nose.        Follow-up [place] for [facility] Follow up.   Specialty: Obstetrics and Gynecology Why: Start prenatal care as soon as possible Contact information: [place] [phone]        [facility] Follow up.   Specialty: Obstetrics and Gynecology Why: As needed for emergencies Contact information: [address] [id number] [place] Certified Nurse-Midwife 05/11/2022  2:58 AM

## 2022-05-11 NOTE — Progress Notes (Signed)
Written and verbal d/c instructions given by Sharen Counter CNM and pt voiced understanding

## 2022-05-17 ENCOUNTER — Other Ambulatory Visit: Payer: Medicaid Other

## 2022-06-13 ENCOUNTER — Telehealth: Payer: Self-pay | Admitting: Family Medicine

## 2022-06-13 NOTE — Telephone Encounter (Signed)
Patient would like to see how see can find out the gender of the baby earlier.

## 2022-06-15 NOTE — Telephone Encounter (Signed)
Attempted to return patient call. She did not answer. Voicemail not set up so not able to leave a message. Will send My Chart message.

## 2022-06-16 ENCOUNTER — Telehealth: Payer: Medicaid Other

## 2022-06-28 ENCOUNTER — Telehealth (INDEPENDENT_AMBULATORY_CARE_PROVIDER_SITE_OTHER): Payer: Medicaid Other

## 2022-06-28 DIAGNOSIS — Z3A00 Weeks of gestation of pregnancy not specified: Secondary | ICD-10-CM

## 2022-06-28 DIAGNOSIS — O0990 Supervision of high risk pregnancy, unspecified, unspecified trimester: Secondary | ICD-10-CM

## 2022-06-28 DIAGNOSIS — Z3481 Encounter for supervision of other normal pregnancy, first trimester: Secondary | ICD-10-CM

## 2022-06-28 MED ORDER — GOJJI WEIGHT SCALE MISC: 1.0000 | 0 refills | Status: DC

## 2022-06-28 MED ORDER — PRENATAL PLUS 27-1 MG PO TABS: 1.0000 | ORAL_TABLET | Freq: Every day | ORAL | 11 refills | Status: DC

## 2022-06-28 MED ORDER — BLOOD PRESSURE MONITORING DEVI: 1.0000 | 0 refills | Status: DC

## 2022-06-28 NOTE — Progress Notes (Signed)
New OB Intake  I connected with  Taylor Velazquez on 06/28/22 at 10:15 AM EDT by MyChart Video Visit and verified that I am speaking with the correct person using two identifiers. Nurse is located at [facility] and pt is located at [facility].  I discussed the limitations, risks, security and privacy concerns of performing an evaluation and management service by telephone and the availability of in person appointments. I also discussed with the patient that there may be a patient responsible charge related to this service. The patient expressed understanding and agreed to proceed.  I explained I am completing New OB Intake today. We discussed her EDD of 12/30/22 that is based on U/S on 05/11/22. Pt is G6/P5. I reviewed her allergies, medications, Medical/Surgical/OB history, and appropriate screenings. I informed her of [facility] services. [facility] information placed in AVS. Based on history, this is a/an  pregnancy uncomplicated .   There are no problems to display for this patient.   Concerns addressed today  Delivery Plans Plans to deliver at [facility]. Patient given information for [facility] website for more information about [facility]. Patient is not interested in water birth. Offered upcoming OB visit with CNM to discuss further.  MyChart/Babyscripts MyChart access verified. I explained pt will have some visits in office and some virtually. Babyscripts instructions given and order placed. Patient verifies receipt of registration text/e-mail. Account successfully created and app downloaded.  Blood Pressure Cuff/Weight Scale Blood pressure cuff ordered for patient to pick-up from [employer]. Explained after first prenatal appt pt will check weekly and document in Babyscripts. Patient does / does not  have weight scale. Weight scale ordered for patient to pick up from [employer].   Anatomy US Explained first scheduled [place] will be around 19 weeks. Anatomy US scheduled for  08/05/22 at 0930. Pt notified to arrive at 0915.  Labs Discussed Avelina Laine genetic screening with patient. Would like both Panorama and Horizon drawn at new OB visit. Routine prenatal labs needed.  Covid Vaccine Patient has covid vaccine.   Is patient a CenteringPregnancy candidate?  Accepted Declined due to NA Not a candidate due to NA Centering Patient" indicated on sticky note   Is patient a Mom+Baby Combined Care candidate?  Not a candidate    Scheduled with Mom+Baby provider   Social Determinants of Health Food Insecurity: Patient denies food insecurity. WIC Referral: Patient is interested in referral to [facility].  Transportation: Patient denies transportation needs. Childcare: Discussed no children allowed at ultrasound appointments. Offered childcare services; patient declines childcare services at this time.  First visit review I reviewed new OB appt with pt. I explained she will have a provider visit that includes . Explained pt will be seen by Dr. Donavan Foil at first visit; encounter routed to appropriate provider. Explained that patient will be seen by pregnancy navigator following visit with provider.   Henrietta Dine, CMA 06/28/2022  10:24 AM

## 2022-06-28 NOTE — Patient Instructions (Signed)
AREA PEDIATRIC/FAMILY PRACTICE PHYSICIANS  [place]outheast [place] ([address])  [facility] Chambliss, MD; Eniola, MD; Hale, MD; Hensel, MD; McDiarmid, MD; McIntyer, MD; Espen Bethel, MD; Walden, MD [address] [place], [place] [address] [phone] Mon-Fri 8:30-12:30, 1:30-5:00 Providers come to see babies at [facility] Accepting Medicaid [facility] providers who accept newborns: Koirala, MD; Morrow, MD; Wolters, MD [address], [place], [place] [address] [phone] Mon-Fri 8:00-5:30 Babies seen by providers at [facility] Does NOT accept Medicaid Please call early in hospitalization for appointment (limited availability)  [facility] Mulberry, MD [address] [place], [place] [address] [phone] Mon, Tue, Thur, Fri 8:30-5:00, Wed 10:00-7:00 (closed 1-2pm) Babies seen by [facility] providers Accepting Medicaid Rubin - Pediatrician Rubin, MD [address], [place], [place] [address] [phone] Mon-Fri 8:30-5:00, Sat 8:30-12:00 Provider comes to see babies at [facility] Accepting Medicaid Must have been referred from current patients or contacted office prior to delivery [facility] ([facility]) Brown, MD; Chandler, MD; Ettefagh, MD; Grant, MD; Lester, MD; McCormick, MD; McQueen, MD; Prose, MD; Simha, MD; Stanley, MD; Stryffeler, NP; Tebben, NP [address], [place], [place] [address] [phone] Mon, Tue, Thur, Fri 8:30-5:30, Wed 9:30-5:30, Sat 8:30-12:30 Babies seen by [facility] providers Accepting Medicaid Only accepting infants of first-time parents or siblings of current patients Hospital discharge coordinator will make follow-up appointment Jack Amos [address], [place], [place]  [address] [phone]   Fax - [phone] [facility] [address], [place], [place]  [address] Phone - [phone]   Fax - [phone] Shilpa Gosrani [address], [place], [place]  [address] [phone]  [place]ortheast [place] ([address]) [facility] Bates, MD; Brassfield, MD; Cooper, Cox, MD; MD; Davis, MD; Dovico, MD; Ettefaugh, MD; Little, MD; Lowe, MD; Keiffer, MD; Melvin, MD; Sumner, MD; Williams, MD [address], [place], [place] [address] [phone] Mon-Fri 8:30-5:00 (extended evenings Mon-Thur as needed), Sat-Sun 10:00-1:00 Providers come to see babies at [facility] Accepting Medicaid for families of first-time babies and families with all children in the household age 3 and under. Must register with office prior to making appointment (M-F only). [facility] Henson, NP; Knapp, MD; Lalonde, MD; Tysinger, PA [address] [place], [place] [address] [phone] Mon-Fri 8:00-5:00 Babies seen by providers at [facility] Does NOT accept Medicaid/Commercial Insurance Only [facility] ([facility])  Artis, MD; Barnes, MD; Bratton, MD; Coccaro, MD; Lockett Gardner, MD; Kramer, MD; Marshall, MD; Netherton, MD; Poleto, MD; Skinner, MD [address] [place], [place] [address] [phone] Mon-Fri 8:30-5:30, Sat (Oct.-Mar.) 9:00-1:00 Babies seen by providers at [facility] Accepting Medicaid  [place] ([address]) [facility] Reid, MD; Warner, MD [address], [place], [place] [address] [phone] Mon-Fri 8:30-5:00, Sat 8:30-12:00 Providers come to see babies at [facility] Does NOT accept Medicaid [facility] Becker, PA; Hagler, MD; Scifres, PA; Sun, MD; Swayne, MD [address], ,  [address] [phone] Mon-Fri 8:00-5:00 Babies seen by providers at [facility] Does NOT accept Medicaid Only accepting babies of parents who  are patients Please call early in hospitalization for appointment (limited availability)  Pediatricians Clark, MD; Frye, MD; Kelleher, MD; Mack, NP; Miller, MD; O'Keller, MD; Patterson, NP; Pudlo, MD; Puzio, MD; Thomas, MD; Tucker, MD; Twiselton, MD [address], [place], [place] [address] [phone] Mon-Fri 8:00-5:00, Sat 9:00-12:00 Providers come to see babies at [facility] Does NOT accept Medicaid  [place] ([address]) [facility] providers accepting new patients: Brake, NP; Wharton, PA [address], [place], [place] [address] [phone] Mon-Fri 8:00-5:00 Babies seen by providers at [facility] Does NOT accept Medicaid Only accepting babies of parents who are patients Please call early in hospitalization for appointment (limited availability) [facility] Gay, MD; Quinlan, MD [address] [place], [place] [address] [phone] (press 1 to schedule appointment) Mon-Fri 8:00-5:00 Providers come to see babies at [facility] Does NOT accept Medicaid [facility] Mazer, MD [address] [place], [place] [address] [phone] Mon-Fri 8:30-5:00 (lunch 12:30-1:00), extended hours by appointment only Wed 5:00-6:30 Babies seen by [facility] providers Accepting Medicaid [facility] Banks, MD; Jordan, MD; Koberlein, MD [address], [place], [place] [address] [phone] Mon-Fri 8:00-5:00 Babies seen by [facility] providers Does NOT accept Medicaid [facility] Parker, MD; Hunter, MD; Wallace, DO [address] [place], [place] [address] [phone] Mon-Fri 8:00-5:00 Babies seen by [facility] providers Does NOT accept Medicaid [facility] Brandon, PA; Brecken, PA; Christy, NP; Dees, MD; DeClaire, MD; DeWeese, MD; Hansen, NP; Mills, NP; Parrish, NP; Smoot, NP; Summer, MD; Vapne,  MD [address] [place], [place] [address] [phone] Mon-Fri 8:30-5:00, Sat 10:00-1:00 Providers come to see babies at [facility] Does NOT accept Medicaid Free prenatal information session Tuesdays at 4:45pm [facility] Bouska, MD; Gordon, PA; Jeffery, PA; Weber, PA [address] [place] [address] [phone] Mon-Fri 7:30-5:30 Babies seen by [facility] providers [facility] [address], [place], [place]  [address] [phone]   Fax - [phone]  [place] ([address] & [address]) [facility] [place] [address] [place], [place] [address] [phone] Mon-Thur 8:00-6:00 Providers come to see babies at [facility] Accepting Medicaid [facility] Anderson, NP; Badger, MD; Beal, PA; Spencer, PA [address] [place], [place] [address] [phone] Mon-Thur 7:30-7:30, Fri 7:30-4:30 Babies seen by [facility] providers Accepting Medicaid [facility] Agbuya, MD; Klett, NP; Romgoolam, MD [address], [place], [place] [address] [phone] Mon-Fri 8:30-5:00, Sat 8:30-12:00 Providers come to see babies at [facility] Accepting Medicaid Must have "Meet & Greet" appointment at office prior to delivery [facility] ([facility]) McCord, MD; Wallace, MD; Wood, MD [address], [place], [place] [address] [phone] Mon-Wed 8:00-6:00, Thur-Fri 8:00-5:00, Sat 9:00-12:00 Providers come to see babies at [facility] Does NOT accept Medicaid Only accepting siblings of current patients [facility]  [address], [place], [place]  [address] [phone]   Fax - [phone] [facility] [address], [place], [place]  [address] [phone]   Fax -  [phone]  [place] ([address] & [address]) [facility] Cirigliano, DO; Matthews, DO [address] , [place] [address] [phone] Mon-Fri 7:00-5:00 Babies seen by [facility] providers Does NOT accept Medicaid [facility] Briscoe, MD; Howley, PA; Moreira, PA [address], [place], [place] [address] [phone] Mon-Fri 8:00-5:00 Babies seen by [facility] providers Accepting Medicaid [facility] Boyd, MD; Church, PA; Jones, NP; Osborn, PA [address], ,  [address] [phone] Mon-Fri 8:00-5:00 Babies seen by providers at [facility] Accepting Medicaid  [place] ([address]) [facility] [place] Wendling, DO [address] [place], [place] [address] [phone] Mon-Fri 8:00-5:00 Babies seen by [facility] providers Does NOT accept Medicaid Limited availability, please call early in hospitalization to schedule follow-up [facility] Calderon, PA; Cummings, MD; Dillard, MD; Martin, PA; Olson, MD; VanDeven, PA [address], [place], [place] [address] [phone] Mon-Fri 8:30-5:00, Sat 9:00-12:00 Babies seen by providers at [facility] Accepting Medicaid Please register online then schedule online or call office [email or web address] ([facility]) Hunter, NP; Kumar, MD; Martin Rogers, PA [address], [place], [place] [address] [phone] Mon-Fri 8:00-5:00 Babies seen by providers at [facility] Accepting Medicaid [facility] ([facility]) [place], MD; Kristi Fleenor, NP; West, MD [address], [place], [place] [address] [phone] Mon-Fri 8:00-5:30, Sat&Sun by appointment (phones open at  8:30) Babies seen by [facility] providers Accepting Medicaid Must be a first-time baby or sibling of current patient [facility] [place]  [address], [place], [place]  [address] [phone]   Fax - [phone]  [place] ([address]) [facility] Brown, PA; Cowen, PA; Rice, MD; Helton, PA; Spry, MD [address] [place], [place] [address] [phone] Mon-Thur 8:00-7:00, Fri 8:00-5:00, Sat 8:00-12:00, Sun 9:00-12:00 Babies seen by [facility] providers Accepting Medicaid [facility] Coe-Goins, MD; Marshall, MD; Pierre-Louis, MD [address], [place], [place] [address] [phone] Mon-Thur 8:00-5:00 Babies seen by providers at [facility] Accepting Medicaid [facility] Bratton, MD; Coe-Goins, MD; Hayes, MD; Lewis, MD; List, MD; Lott, MD; Marshall, MD; Moran, MD; O'Arrow Tomko, MD; Pierre-Louis, MD; Pitonzo, MD; Scholer, MD; Spangle, MD [address] [place], [place] [address] [phone] Mon-Fri 8:00-5:30, Sat (Oct.-Mar.) 9:00-1:00 Babies seen by providers at [facility] Accepting Medicaid Must fill out new patient packet, available online at [email or web address] [facility] ([facility]) Friddle, NP; Harris, NP; Kelly, NP; Logan, MD; Melvin, PA; Poth, MD; Ramadoss, MD; Stanton, NP [address], [place], [place] [address] [phone] Mon-Thur 8:00-5:30, Fri 8:00-5:00 Babies seen by providers at [facility] Accepting Medicaid  [place] ([address]) [facility] Dixon, PA; [place], MD; Pickard, MD; Tapia, PA [address], [place], [place] [address] [phone] Mon-Fri 8:00-5:00 Babies seen by providers at [facility] Accepting Medicaid   [place] ([address]) [facility] [place] Masneri, DO; Meyers, MD; Nelson, PA [address], [place], [place] [address] [phone] Mon-Fri 8:00-5:00 Babies seen by providers at [facility] Does NOT accept Medicaid Limited appointment availability, please call early in hospitalization  [facility] [place] Kunedd, DO; McGowen, MD [address], [place], [place] [address] [phone] Mon-Fri 8:00-5:00 Babies seen by [facility] providers Does NOT accept Medicaid [facility] [place] Cameron, MD; MacDonald, MD; Michaels, PA; Nayak, MD [address] [place] [address], [place], [place] [address] [phone] Mon-Fri 8:00-5:00 After hours clinic ([address] [place], [place] [address]) [phone] Mon-Fri 5:00-8:00, Sat 12:00-6:00, Sun 10:00-4:00 Babies seen by [facility] providers Accepting Medicaid [facility] [place] [address], [place], [place]  [address] [phone]   Fax - [phone]  [place] ([address]) [facility] [place] [facility] Andy, MD [address], [place], [place] [address] [phone] Mon-Fri 8:00-5:00 Babies seen by [facility] providers Does NOT accept Medicaid [facility] [place] ([facility] [place]) Eksir, MD [address], [place], [place] [address] [phone] Mon-Thur 8:00-7:00, Fri 8:00-5:00, Sat 8:00-12:00 Babies seen by providers at [facility] Accepting Medicaid - but does not have vaccinations in office (must be received elsewhere) Limited availability, please call early in hospitalization  [place] ([address]) [facility]  Charlene Flemming, MD [address], [place] [address] [phone]  Fax [phone]  [facility]  Kimberly Newton, MD, Annamarie Streilein, PA, Carla Hampton, PA [address] [place], [place]  [address] [phone] [facility]  [address], [place], [place] [address] [phone] [address], [place], [place] [address] [phone] (West Office)  [facility] [address], [place], [place] [address] [phone] [facility] [address], [place], [place] [address] [phone] [facility] [address], [place], [place] [address] [phone] [facility] [address], [place], [place] [address] [phone] [facility] [address], [place], [place] [address] [phone] [facility] [address], [place], [place] [address] [phone] [facility]  [address], [place], [place] [address] [phone] Dr[facility] [address] [place], [place], [place] [address] [phone] [facility] [address], [place], [place] [address] [phone] [facility] [address], [place], [place] [address] [phone]  

## 2022-06-28 NOTE — Progress Notes (Signed)
PT reported that she feels that she may have a UTI,the only symptom that she has is a strong odor, no frequency, burning, pain or blood in urine. She states that she drinks plenty of water. Advised her to get AZO until Dr. Visit especially since she's not having any UTI symptoms.Pt verbalized understanding.

## 2022-07-06 ENCOUNTER — Other Ambulatory Visit: Payer: Self-pay

## 2022-07-06 ENCOUNTER — Inpatient Hospital Stay (HOSPITAL_COMMUNITY)
Admission: AD | Admit: 2022-07-06 | Discharge: 2022-07-07 | Disposition: A | Discharge status: Home / Self Care | Payer: Medicaid Other | Attending: Obstetrics and Gynecology | Admitting: Obstetrics and Gynecology

## 2022-07-06 DIAGNOSIS — O4692 Antepartum hemorrhage, unspecified, second trimester: Secondary | ICD-10-CM

## 2022-07-06 DIAGNOSIS — O26892 Other specified pregnancy related conditions, second trimester: Secondary | ICD-10-CM | POA: Insufficient documentation

## 2022-07-06 DIAGNOSIS — R1030 Lower abdominal pain, unspecified: Secondary | ICD-10-CM | POA: Insufficient documentation

## 2022-07-06 DIAGNOSIS — O209 Hemorrhage in early pregnancy, unspecified: Secondary | ICD-10-CM | POA: Insufficient documentation

## 2022-07-06 DIAGNOSIS — Z3A14 14 weeks gestation of pregnancy: Secondary | ICD-10-CM | POA: Insufficient documentation

## 2022-07-06 DIAGNOSIS — O09292 Supervision of pregnancy with other poor reproductive or obstetric history, second trimester: Secondary | ICD-10-CM | POA: Insufficient documentation

## 2022-07-07 ENCOUNTER — Encounter (HOSPITAL_COMMUNITY): Payer: Self-pay | Admitting: Obstetrics and Gynecology

## 2022-07-07 DIAGNOSIS — O26892 Other specified pregnancy related conditions, second trimester: Secondary | ICD-10-CM

## 2022-07-07 DIAGNOSIS — R109 Unspecified abdominal pain: Secondary | ICD-10-CM | POA: Diagnosis not present

## 2022-07-07 DIAGNOSIS — R1030 Lower abdominal pain, unspecified: Secondary | ICD-10-CM | POA: Diagnosis present

## 2022-07-07 DIAGNOSIS — O09292 Supervision of pregnancy with other poor reproductive or obstetric history, second trimester: Secondary | ICD-10-CM | POA: Diagnosis not present

## 2022-07-07 DIAGNOSIS — O209 Hemorrhage in early pregnancy, unspecified: Secondary | ICD-10-CM | POA: Diagnosis not present

## 2022-07-07 DIAGNOSIS — O4692 Antepartum hemorrhage, unspecified, second trimester: Secondary | ICD-10-CM

## 2022-07-07 DIAGNOSIS — Z3A14 14 weeks gestation of pregnancy: Secondary | ICD-10-CM | POA: Diagnosis not present

## 2022-07-07 LAB — URINALYSIS, ROUTINE W REFLEX MICROSCOPIC
Bilirubin Urine: NEGATIVE
Glucose, UA: NEGATIVE mg/dL
Hgb urine dipstick: NEGATIVE
Ketones, ur: NEGATIVE mg/dL
Leukocytes,Ua: NEGATIVE
Nitrite: NEGATIVE
Protein, ur: NEGATIVE mg/dL
Specific Gravity, Urine: 1.027 (ref 1.005–1.030)
pH: 5 (ref 5.0–8.0)

## 2022-07-07 LAB — GC/CHLAMYDIA PROBE AMP (~~LOC~~) NOT AT ARMC
Chlamydia: NEGATIVE
Comment: NEGATIVE
Comment: NORMAL
Neisseria Gonorrhea: NEGATIVE

## 2022-07-07 LAB — WET PREP, GENITAL
Clue Cells Wet Prep HPF POC: NONE SEEN
Sperm: NONE SEEN
Trich, Wet Prep: NONE SEEN
WBC, Wet Prep HPF POC: <10 (ref <10)
Yeast Wet Prep HPF POC: NONE SEEN

## 2022-07-07 NOTE — MAU Provider Note (Signed)
Chief Complaint: Vaginal Bleeding and Abdominal Pain   Event Date/Time   First Provider Initiated Contact with Patient 07/07/22 0211      SUBJECTIVE HPI: Taylor Velazquez is a 26 y.o. G6P5006 at [redacted]w[redacted]d by early ultrasound who presents to maternity admissions reporting light spotting and lower abdominal cramping while at work tonight.  She reports she lifts some heavy things while at work.  The bleeding was seen while wiping x 1 and did not soak the tissue she put in her underwear at work or the pad she put on when she got home.  There are no other symptoms.    HPI  Past Medical History:  Diagnosis Date   Gestational hypertension    Past Surgical History:  Procedure Laterality Date   CESAREAN SECTION  2014   Patient had another in 2015, 2016,2017,2019   Social History   Socioeconomic History   Marital status: Single    Spouse name: Not on file   Number of children: Not on file   Years of education: Not on file   Highest education level: Not on file  Occupational History   Not on file  Tobacco Use   Smoking status: Never   Smokeless tobacco: Never  Vaping Use   Vaping Use: Never used  Substance and Sexual Activity   Alcohol use: Never   Drug use: Never   Sexual activity: Yes    Birth control/protection: None  Other Topics Concern   Not on file  Social History Narrative   Not on file   Social Determinants of Health   Financial Resource Strain: Not on file  Food Insecurity: Not on file  Transportation Needs: Not on file  Physical Activity: Not on file  Stress: Not on file  Social Connections: Not on file  Intimate Partner Violence: Not on file   No current facility-administered medications on file prior to encounter.   Current Outpatient Medications on File Prior to Encounter  Medication Sig Dispense Refill   Blood Pressure Monitoring DEVI 1 each by Does not apply route once a week. 1 each 0   cyclobenzaprine (FLEXERIL) 10 MG tablet Take 1 tablet (10 mg total)  by mouth 2 (two) times daily as needed for muscle spasms. 20 tablet 0   diphenhydramine-acetaminophen (TYLENOL PM) 25-500 MG TABS tablet Take 1 tablet by mouth at bedtime as needed.     EPINEPHrine 0.3 mg/0.3 mL IJ SOAJ injection INJECT 0.3 ML IN THE MUSCLE ONCE AS NEEDED FOR ANAPHYLAXIS FOR UP TO 1 DOSE     ipratropium (ATROVENT) 0.03 % nasal spray Place into the nose. (Patient not taking: Reported on 06/28/2022)     Misc. Devices (GOJJI WEIGHT SCALE) MISC 1 each by Does not apply route once a week. 1 each 0   prenatal vitamin w/FE, FA (PRENATAL 1 + 1) 27-1 MG TABS tablet Take 1 tablet by mouth daily at 12 noon. 30 tablet 11   No Known Allergies  ROS:  Review of Systems  Constitutional:  Negative for chills, fatigue and fever.  Respiratory:  Negative for shortness of breath.   Cardiovascular:  Negative for chest pain.  Gastrointestinal:  Positive for abdominal pain. Negative for nausea and vomiting.  Genitourinary:  Positive for vaginal bleeding. Negative for difficulty urinating, dysuria, flank pain, pelvic pain, vaginal discharge and vaginal pain.  Neurological:  Negative for dizziness and headaches.  Psychiatric/Behavioral: Negative.       I have reviewed patient's Past Medical Hx, Surgical Hx, Family Hx, Social Hx, medications  and allergies.   Physical Exam  Patient Vitals for the past 24 hrs:  BP Temp Temp src Pulse Resp SpO2 Height Weight  07/07/22 0325 (!) 127/94 -- -- (!) 179 -- 99 % -- --  07/07/22 0018 127/83 98 F (36.7 C) Oral 90 16 98 % 5\' 8"  (1.727 m) 107.2 kg   Constitutional: Well-developed, well-nourished female in no acute distress.  Cardiovascular: normal rate Respiratory: normal effort GI: Abd soft, non-tender. Pos BS x 4 MS: Extremities nontender, no edema, normal ROM Neurologic: Alert and oriented x 4.  GU: Neg CVAT.  PELVIC EXAM: Cervix pink, visually closed, without lesion, scant white creamy discharge, vaginal walls and external genitalia normal Bimanual  exam: Cervix 0/long/high, firm, anterior, neg CMT, uterus nontender, nonenlarged, adnexa without tenderness, enlargement, or mass  FHT 154 by doppler  LAB RESULTS Results for orders placed or performed during the hospital encounter of 07/06/22 (from the past 24 hour(s))  Wet prep, genital     Status: None   Collection Time: 07/07/22  1:44 AM  Result Value Ref Range   Yeast Wet Prep HPF POC NONE SEEN NONE SEEN   Trich, Wet Prep NONE SEEN NONE SEEN   Clue Cells Wet Prep HPF POC NONE SEEN NONE SEEN   WBC, Wet Prep HPF POC <10 <10   Sperm NONE SEEN   Urinalysis, Routine w reflex microscopic     Status: None   Collection Time: 07/07/22  2:54 AM  Result Value Ref Range   Color, Urine YELLOW YELLOW   APPearance CLEAR CLEAR   Specific Gravity, Urine 1.027 1.005 - 1.030   pH 5.0 5.0 - 8.0   Glucose, UA NEGATIVE NEGATIVE mg/dL   Hgb urine dipstick NEGATIVE NEGATIVE   Bilirubin Urine NEGATIVE NEGATIVE   Ketones, ur NEGATIVE NEGATIVE mg/dL   Protein, ur NEGATIVE NEGATIVE mg/dL   Nitrite NEGATIVE NEGATIVE   Leukocytes,Ua NEGATIVE NEGATIVE    --/--/O POS (06/02 0121)  IMAGING No results found.  MAU Management/MDM: Orders Placed This Encounter  Procedures   Wet prep, genital   Urinalysis, Routine w reflex microscopic   Discharge patient    No orders of the defined types were placed in this encounter.   Light bleeding after lifting at work, resolved when resting. Wet prep wnl, GCC pending. Cervix closed, no evidence of labor.  Note for pt to miss work x 2 days and pregnancy restrictions letter for when she returns.  F[facility]U at [facility] as scheduled, return to MAU as needed for emergencies.    ASSESSMENT 1. Vaginal bleeding in pregnancy, second trimester   2. Abdominal pain during pregnancy in second trimester   3. [redacted] weeks gestation of pregnancy     PLAN Discharge home Allergies as of 07/07/2022   No Known Allergies      Medication List     TAKE these medications    Blood  Pressure Monitoring Devi 1 each by Does not apply route once a week.   cyclobenzaprine 10 MG tablet Commonly known as: FLEXERIL Take 1 tablet (10 mg total) by mouth 2 (two) times daily as needed for muscle spasms.   diphenhydramine-acetaminophen 25-500 MG Tabs tablet Commonly known as: TYLENOL PM Take 1 tablet by mouth at bedtime as needed.   EPINEPHrine 0.3 mg/0.3 mL Soaj injection Commonly known as: EPI-PEN INJECT 0.3 ML IN THE MUSCLE ONCE AS NEEDED FOR ANAPHYLAXIS FOR UP TO 1 DOSE   Gojji Weight Scale Misc 1 each by Does not apply route once a  week.   ipratropium 0.03 % nasal spray Commonly known as: ATROVENT Place into the nose.   prenatal vitamin w/FE, FA 27-1 MG Tabs tablet Take 1 tablet by mouth daily at 12 noon.        Follow-up Information     [facility] Follow up.   Specialty: Obstetrics and Gynecology Why: For Centering Pregnancy as scheduled, As needed for emergencies Contact information: [address] [place] [address] [phone]                Sharen Counter Certified Nurse-Midwife 07/07/2022  3:55 AM

## 2022-07-07 NOTE — MAU Note (Signed)
..  Nida Manfredi is a 26 y.o. at [redacted]w[redacted]d here in MAU reporting: spotting that began yesterday, a small amount on pad but more when she wipes. Has sharp abdominal pain that comes and goes. Breast tenderness that is different from her other pregnancies.   Pain score: 0/10 (7/10 when the pain comes) Vitals:   07/07/22 0018  BP: 127/83  Pulse: 90  Resp: 16  Temp: 98 F (36.7 C)  SpO2: 98%     FHT:154 Lab orders placed from triage: UA

## 2022-07-15 ENCOUNTER — Ambulatory Visit (INDEPENDENT_AMBULATORY_CARE_PROVIDER_SITE_OTHER): Payer: Medicaid Other | Admitting: Obstetrics and Gynecology

## 2022-07-15 ENCOUNTER — Encounter: Payer: Self-pay | Admitting: Obstetrics and Gynecology

## 2022-07-15 ENCOUNTER — Encounter: Payer: Self-pay | Admitting: Lactation Services

## 2022-07-15 ENCOUNTER — Other Ambulatory Visit: Payer: Self-pay

## 2022-07-15 VITALS — BP 125/85 | HR 82 | Wt 235.3 lb

## 2022-07-15 DIAGNOSIS — O0990 Supervision of high risk pregnancy, unspecified, unspecified trimester: Secondary | ICD-10-CM

## 2022-07-15 DIAGNOSIS — Z3A16 16 weeks gestation of pregnancy: Secondary | ICD-10-CM | POA: Diagnosis not present

## 2022-07-15 DIAGNOSIS — O0992 Supervision of high risk pregnancy, unspecified, second trimester: Secondary | ICD-10-CM

## 2022-07-15 DIAGNOSIS — Z8759 Personal history of other complications of pregnancy, childbirth and the puerperium: Secondary | ICD-10-CM

## 2022-07-15 DIAGNOSIS — Z98891 History of uterine scar from previous surgery: Secondary | ICD-10-CM

## 2022-07-15 MED ORDER — ASPIRIN 81 MG PO CHEW: 81.0000 mg | CHEWABLE_TABLET | Freq: Every day | ORAL | 8 refills | Status: DC

## 2022-07-15 NOTE — Progress Notes (Signed)
INITIAL PRENATAL VISIT NOTE  Subjective:  Taylor Velazquez is a 26 y.o. G6P5006 at [redacted]w[redacted]d by LMP c/w early u/s being seen today for her initial prenatal visit.  She has an obstetric history significant for gestational hypertension and cesarean section x 4 with classical c section x 1.  Last op note in care everywhere showed extensive intra-abdominal adhesive diseas, and she has a midline scar extending 3-4 cm above the umbilicus She has an uncomplicated medical hx .  Patient reports no complaints.  Contractions: Not present. Vag. Bleeding: None.  Movement: Absent. Denies leaking of fluid.    Past Medical History:  Diagnosis Date   Gestational hypertension     Past Surgical History:  Procedure Laterality Date   CESAREAN SECTION  2014   Patient had another in 2015, 2016,2017,2019    OB History  Gravida Para Term Preterm AB Living  6 5 5    0 6  SAB IAB Ectopic Multiple Live Births  0 0 0 1 6    # Outcome Date GA Lbr Len/2nd Weight Sex Delivery Anes PTL Lv  6 Current           5 Term 08/22/18    M CS-LTranv   LIV  4 Term 03/30/16    M CS-LTranv   LIV  3 Term 01/01/15    M CS-LTranv   LIV  2A Term 01/03/14 [redacted]w[redacted]d   M CS-LTranv  N LIV  2B Term 01/03/14 [redacted]w[redacted]d   F CS-Unspec   LIV  1 Term 01/08/13    03/08/13   LIV    Social History   Socioeconomic History   Marital status: Single    Spouse name: Not on file   Number of children: Not on file   Years of education: Not on file   Highest education level: Not on file  Occupational History   Not on file  Tobacco Use   Smoking status: Never   Smokeless tobacco: Never  Vaping Use   Vaping Use: Never used  Substance and Sexual Activity   Alcohol use: Never   Drug use: Never   Sexual activity: Yes    Birth control/protection: None  Other Topics Concern   Not on file  Social History Narrative   Not on file   Social Determinants of Health   Financial Resource Strain: Not on file  Food Insecurity: Not on file   Transportation Needs: Not on file  Physical Activity: Not on file  Stress: Not on file  Social Connections: Not on file    History reviewed. No pertinent family history.   Current Outpatient Medications:    diphenhydramine-acetaminophen (TYLENOL PM) 25-500 MG TABS tablet, Take 1 tablet by mouth at bedtime as needed., Disp: , Rfl:    Blood Pressure Monitoring DEVI, 1 each by Does not apply route once a week. (Patient not taking: Reported on 07/15/2022), Disp: 1 each, Rfl: 0   cyclobenzaprine (FLEXERIL) 10 MG tablet, Take 1 tablet (10 mg total) by mouth 2 (two) times daily as needed for muscle spasms. (Patient not taking: Reported on 07/15/2022), Disp: 20 tablet, Rfl: 0   EPINEPHrine 0.3 mg/0.3 mL IJ SOAJ injection, INJECT 0.3 ML IN THE MUSCLE ONCE AS NEEDED FOR ANAPHYLAXIS FOR UP TO 1 DOSE (Patient not taking: Reported on 07/15/2022), Disp: , Rfl:    ipratropium (ATROVENT) 0.03 % nasal spray, Place into the nose. (Patient not taking: Reported on 06/28/2022), Disp: , Rfl:    Misc. Devices (GOJJI WEIGHT SCALE) MISC, 1  each by Does not apply route once a week. (Patient not taking: Reported on 07/15/2022), Disp: 1 each, Rfl: 0   prenatal vitamin w/FE, FA (PRENATAL 1 + 1) 27-1 MG TABS tablet, Take 1 tablet by mouth daily at 12 noon. (Patient not taking: Reported on 07/15/2022), Disp: 30 tablet, Rfl: 11  No Known Allergies  Review of Systems: Negative except for what is mentioned in HPI.  Objective:   Vitals:   07/15/22 1010  BP: 125/85  Pulse: 82  Weight: 235 lb 4.8 oz (106.7 kg)    Fetal Status: Fetal Heart Rate (bpm): 154   Movement: Absent     Physical Exam: BP 125/85   Pulse 82   Wt 235 lb 4.8 oz (106.7 kg)   LMP 04/02/2022   BMI 35.78 kg/m  CONSTITUTIONAL: Well-developed, well-nourished female in no acute distress.  NEUROLOGIC: Alert and oriented to person, place, and time. Normal reflexes, muscle tone coordination. No cranial nerve deficit noted. PSYCHIATRIC: Normal mood and  affect. Normal behavior. Normal judgment and thought content. SKIN: Skin is warm and dry. No rash noted. Not diaphoretic. No erythema. No pallor. HENT:  Normocephalic, atraumatic, External right and left ear normal. Oropharynx is clear and moist EYES: Conjunctivae and EOM are normal.  NECK: Normal range of motion, supple, no masses CARDIOVASCULAR: Normal heart rate noted, regular rhythm RESPIRATORY: Effort and breath sounds normal, no problems with respiration noted BREASTS: deferred ABDOMEN: Soft, nontender, nondistended, gravid.  Midline scar noted as above along with well healed transverse incisions GU: deferred per pt request MUSCULOSKELETAL: Normal range of motion. EXT:  No edema and no tenderness. 2+ distal pulses.   Assessment and Plan:  Pregnancy: G6P5006 at [redacted]w[redacted]d by LMP  1. Supervision of high risk pregnancy, antepartum Continue routine prenatal care  2. [redacted] weeks gestation of pregnancy   3. hx of cesarean x 5 Pt will need repeat with BTL, likely with midline incision and 2-3 MD due to high risk of complications  4. History of gestational hypertension Baseline labs today Will start baby ASA now, question stopping closer to delivery   Preterm labor symptoms and general obstetric precautions including but not limited to vaginal bleeding, contractions, leaking of fluid and fetal movement were reviewed in detail with the patient.  Please refer to After Visit Summary for other counseling recommendations.   Return in about 4 weeks (around 08/12/2022) for [facility], in person.  Warden Fillers 07/15/2022 11:00 AM

## 2022-07-16 LAB — COMPREHENSIVE METABOLIC PANEL
ALT: 28 IU/L (ref 0–32)
AST: 26 IU/L (ref 0–40)
Albumin/Globulin Ratio: 1.4 (ref 1.2–2.2)
Albumin: 3.7 g/dL — ABNORMAL LOW (ref 4.0–5.0)
Alkaline Phosphatase: 48 IU/L (ref 44–121)
BUN/Creatinine Ratio: 11 (ref 9–23)
BUN: 6 mg/dL (ref 6–20)
Bilirubin Total: 0.3 mg/dL (ref 0.0–1.2)
CO2: 18 mmol/L — ABNORMAL LOW (ref 20–29)
Calcium: 9 mg/dL (ref 8.7–10.2)
Chloride: 106 mmol/L (ref 96–106)
Creatinine, Ser: 0.54 mg/dL — ABNORMAL LOW (ref 0.57–1.00)
Globulin, Total: 2.6 g/dL (ref 1.5–4.5)
Glucose: 90 mg/dL (ref 70–99)
Potassium: 3.9 mmol/L (ref 3.5–5.2)
Sodium: 140 mmol/L (ref 134–144)
Total Protein: 6.3 g/dL (ref 6.0–8.5)
eGFR: 129 mL/min/{1.73_m2} (ref >59)

## 2022-07-16 LAB — CBC/D/PLT+RPR+RH+ABO+RUBIGG...
Antibody Screen: NEGATIVE
Basophils Absolute: 0 10*3/uL (ref 0.0–0.2)
Basos: 0 %
EOS (ABSOLUTE): 0.1 10*3/uL (ref 0.0–0.4)
Eos: 1 %
HCV Ab: NONREACTIVE
HIV Screen 4th Generation wRfx: NONREACTIVE
Hematocrit: 34.5 % (ref 34.0–46.6)
Hemoglobin: 11.6 g/dL (ref 11.1–15.9)
Hepatitis B Surface Ag: NEGATIVE
Immature Grans (Abs): 0 10*3/uL (ref 0.0–0.1)
Immature Granulocytes: 0 %
Lymphocytes Absolute: 2 10*3/uL (ref 0.7–3.1)
Lymphs: 22 %
MCH: 27.4 pg (ref 26.6–33.0)
MCHC: 33.6 g/dL (ref 31.5–35.7)
MCV: 81 fL (ref 79–97)
Monocytes Absolute: 0.4 10*3/uL (ref 0.1–0.9)
Monocytes: 5 %
Neutrophils Absolute: 6.6 10*3/uL (ref 1.4–7.0)
Neutrophils: 72 %
Platelets: 145 10*3/uL — ABNORMAL LOW (ref 150–450)
RBC: 4.24 x10E6/uL (ref 3.77–5.28)
RDW: 13.3 % (ref 11.7–15.4)
RPR Ser Ql: NONREACTIVE
Rh Factor: POSITIVE
Rubella Antibodies, IGG: 1.38 index (ref >0.99)
WBC: 9.1 10*3/uL (ref 3.4–10.8)

## 2022-07-16 LAB — HCV INTERPRETATION

## 2022-07-16 LAB — PROTEIN / CREATININE RATIO, URINE
Creatinine, Urine: 231.5 mg/dL
Protein, Ur: 17.2 mg/dL
Protein/Creat Ratio: 74 mg/g creat (ref 0–200)

## 2022-07-16 LAB — TSH: TSH: 1.18 u[IU]/mL (ref 0.450–4.500)

## 2022-07-16 LAB — HEMOGLOBIN A1C
Est. average glucose Bld gHb Est-mCnc: 105 mg/dL
Hgb A1c MFr Bld: 5.3 % (ref 4.8–5.6)

## 2022-07-18 LAB — CULTURE, OB URINE

## 2022-07-18 LAB — URINE CULTURE, OB REFLEX

## 2022-07-21 ENCOUNTER — Encounter: Payer: Self-pay | Admitting: *Deleted

## 2022-07-23 LAB — PANORAMA PRENATAL TEST FULL PANEL:PANORAMA TEST PLUS 5 ADDITIONAL MICRODELETIONS: FETAL FRACTION: 5.2

## 2022-07-24 ENCOUNTER — Encounter: Payer: Self-pay | Admitting: Radiology

## 2022-07-30 LAB — HORIZON CUSTOM: REPORT SUMMARY: POSITIVE — AB

## 2022-08-02 ENCOUNTER — Encounter: Payer: Self-pay | Admitting: Obstetrics and Gynecology

## 2022-08-02 DIAGNOSIS — D560 Alpha thalassemia: Secondary | ICD-10-CM | POA: Insufficient documentation

## 2022-08-02 DIAGNOSIS — D563 Thalassemia minor: Secondary | ICD-10-CM | POA: Insufficient documentation

## 2022-08-04 ENCOUNTER — Telehealth: Payer: Self-pay

## 2022-08-04 NOTE — Telephone Encounter (Addendum)
-----   Message from Warden Fillers, MD sent at 08/02/2022  5:45 PM EDT ----- Alpha thalassemia noted in MOB, refer to genetic counseling  Called pt; VM left stating I am calling patient with results. MyChart message sent.

## 2022-08-05 ENCOUNTER — Ambulatory Visit: Payer: Medicaid Other | Attending: Obstetrics and Gynecology

## 2022-08-05 ENCOUNTER — Ambulatory Visit: Payer: Medicaid Other | Admitting: *Deleted

## 2022-08-05 ENCOUNTER — Other Ambulatory Visit: Payer: Self-pay | Admitting: *Deleted

## 2022-08-05 VITALS — BP 124/86 | HR 88

## 2022-08-05 DIAGNOSIS — O34219 Maternal care for unspecified type scar from previous cesarean delivery: Secondary | ICD-10-CM

## 2022-08-05 DIAGNOSIS — Z8759 Personal history of other complications of pregnancy, childbirth and the puerperium: Secondary | ICD-10-CM

## 2022-08-05 DIAGNOSIS — O0990 Supervision of high risk pregnancy, unspecified, unspecified trimester: Secondary | ICD-10-CM

## 2022-08-05 DIAGNOSIS — O99212 Obesity complicating pregnancy, second trimester: Secondary | ICD-10-CM

## 2022-08-08 ENCOUNTER — Encounter: Payer: Self-pay | Admitting: *Deleted

## 2022-08-10 ENCOUNTER — Telehealth: Payer: Self-pay

## 2022-08-10 NOTE — Telephone Encounter (Signed)
Called patient regarding having her genetic counseling appointment on 10/6 be virtual. Left her a voicemail to call back and let us know if she would like that to be changed.

## 2022-08-12 ENCOUNTER — Encounter: Payer: Self-pay | Admitting: Obstetrics and Gynecology

## 2022-08-23 ENCOUNTER — Encounter: Payer: Self-pay | Admitting: Advanced Practice Midwife

## 2022-08-24 ENCOUNTER — Encounter: Payer: Medicaid Other | Admitting: Advanced Practice Midwife

## 2022-08-28 ENCOUNTER — Encounter: Payer: Self-pay | Admitting: Radiology

## 2022-08-29 ENCOUNTER — Telehealth: Payer: Self-pay | Admitting: *Deleted

## 2022-08-29 NOTE — Telephone Encounter (Signed)
Taylor Velazquez centeringpregnancy prenatal care appt 08/24/22. I called her to discuss. She states she is having car trouble. I gave her phone number for Medicaid transportation . We discussed her centering appointments. She request to be be changed to traditional care due to transportation issues. I explained I can unenroll  her and changed to tradtional and registrar will contact her with appt.  She voices understanding. Staci Acosta

## 2022-08-31 ENCOUNTER — Encounter: Payer: Medicaid Other | Admitting: Family Medicine

## 2022-09-02 ENCOUNTER — Encounter: Payer: Self-pay | Admitting: Genetics

## 2022-09-02 ENCOUNTER — Ambulatory Visit: Payer: Medicaid Other

## 2022-09-09 ENCOUNTER — Ambulatory Visit: Payer: Medicaid Other

## 2022-09-26 ENCOUNTER — Other Ambulatory Visit: Payer: Self-pay | Admitting: *Deleted

## 2022-09-26 DIAGNOSIS — O0990 Supervision of high risk pregnancy, unspecified, unspecified trimester: Secondary | ICD-10-CM

## 2022-09-28 ENCOUNTER — Telehealth: Payer: Self-pay

## 2022-09-28 ENCOUNTER — Other Ambulatory Visit: Payer: Medicaid Other

## 2022-09-28 ENCOUNTER — Encounter: Payer: Medicaid Other | Admitting: Family Medicine

## 2022-09-28 ENCOUNTER — Encounter: Payer: Medicaid Other | Admitting: Certified Nurse Midwife

## 2022-09-28 NOTE — Telephone Encounter (Signed)
Pt is wanting to r/s her appt d/t being in [place] and not wanting to "come for a 20 min appt and turn back around". I advised her we need to do her 28 week labs soon and she needs a repeat u/s. But pt still wants the front desk to call to r/s

## 2022-09-29 ENCOUNTER — Encounter: Payer: Self-pay | Admitting: Family Medicine

## 2022-09-29 DIAGNOSIS — Z7189 Other specified counseling: Secondary | ICD-10-CM | POA: Insufficient documentation

## 2022-09-29 DIAGNOSIS — Z8759 Personal history of other complications of pregnancy, childbirth and the puerperium: Secondary | ICD-10-CM | POA: Insufficient documentation

## 2022-10-17 ENCOUNTER — Other Ambulatory Visit: Payer: Self-pay | Admitting: Certified Nurse Midwife

## 2022-10-17 NOTE — Addendum Note (Signed)
Addended by: Fonda Kinder on: 10/17/2022 01:17 PM   Modules accepted: Orders

## 2022-10-17 NOTE — Progress Notes (Signed)
Encounter opened in error. KW 

## 2022-10-18 ENCOUNTER — Telehealth: Payer: Self-pay | Admitting: Certified Nurse Midwife

## 2022-10-18 ENCOUNTER — Other Ambulatory Visit: Payer: Medicaid Other

## 2022-10-18 ENCOUNTER — Encounter: Payer: Medicaid Other | Admitting: Certified Nurse Midwife

## 2022-10-18 NOTE — Telephone Encounter (Signed)
Reached out to pt to reschedule lab appt (28 week labs) and NOB appt with Doreene Burke that was scheduled on 11/20 at 8:40 (labs) and 9:55 (NOB).  Left message for pt to call back to reschedule.

## 2022-10-19 NOTE — Telephone Encounter (Signed)
Reached pt to reschedule lab appt (28 week labs) and NOB appt that was scheduled on 11/20 at 8:40 (labs) and 9:55 (NOB).  Pt is scheduled with LMD on 11/27 at 9:55 and labs at 11:00.

## 2022-10-24 ENCOUNTER — Other Ambulatory Visit: Payer: Self-pay

## 2022-10-24 ENCOUNTER — Other Ambulatory Visit: Payer: Medicaid Other

## 2022-10-24 ENCOUNTER — Encounter: Payer: Self-pay | Admitting: Licensed Practical Nurse

## 2022-10-24 ENCOUNTER — Ambulatory Visit (INDEPENDENT_AMBULATORY_CARE_PROVIDER_SITE_OTHER): Payer: Medicaid Other | Admitting: Licensed Practical Nurse

## 2022-10-24 VITALS — BP 118/81 | HR 94 | Wt 241.5 lb

## 2022-10-24 DIAGNOSIS — O34219 Maternal care for unspecified type scar from previous cesarean delivery: Secondary | ICD-10-CM | POA: Diagnosis not present

## 2022-10-24 DIAGNOSIS — Z369 Encounter for antenatal screening, unspecified: Secondary | ICD-10-CM

## 2022-10-24 DIAGNOSIS — Z131 Encounter for screening for diabetes mellitus: Secondary | ICD-10-CM

## 2022-10-24 DIAGNOSIS — O0933 Supervision of pregnancy with insufficient antenatal care, third trimester: Secondary | ICD-10-CM

## 2022-10-24 DIAGNOSIS — Z3A30 30 weeks gestation of pregnancy: Secondary | ICD-10-CM

## 2022-10-24 DIAGNOSIS — O0930 Supervision of pregnancy with insufficient antenatal care, unspecified trimester: Secondary | ICD-10-CM

## 2022-10-24 DIAGNOSIS — Z113 Encounter for screening for infections with a predominantly sexual mode of transmission: Secondary | ICD-10-CM

## 2022-10-24 DIAGNOSIS — O0990 Supervision of high risk pregnancy, unspecified, unspecified trimester: Secondary | ICD-10-CM

## 2022-10-24 NOTE — Progress Notes (Signed)
Lab orders incorrect per [employer]. Orders replaced.

## 2022-10-24 NOTE — Progress Notes (Unsigned)
Routine Prenatal Care Visit  Subjective  Taylor Velazquez is a 26 y.o. G6P5006 at [redacted]w[redacted]d being seen today for ongoing prenatal care.  She is currently monitored for the following issues for this {Blank single:19197::"high-risk","low-risk"} pregnancy and has Supervision of high risk pregnancy, antepartum; Previous cesarean delivery affecting pregnancy, antepartum; History of gestational hypertension; Alpha thalassemia silent carrier; Red Chart Rounds Patient; and History of postpartum hemorrhage on their problem list.  ----------------------------------------------------------------------------------- Patient reports {sx:14538}.   Contractions: Not present. Vag. Bleeding: None.  Movement: Present. Leaking Fluid {Actions; denies/reports/admits to:19208}.  ----------------------------------------------------------------------------------- The following portions of the patient's history were reviewed and updated as appropriate: allergies, current medications, past family history, past medical history, past social history, past surgical history and problem list. Problem list updated.  Objective  Blood pressure 118/81, pulse 94, weight 241 lb 8 oz (109.5 kg), last menstrual period 04/02/2022. Pregravid weight 235 lb (106.6 kg) Total Weight Gain 6 lb 8 oz (2.948 kg) Urinalysis: Urine Protein    Urine Glucose    Fetal Status:     Movement: Present     General:  Alert, oriented and cooperative. Patient is in no acute distress.  Skin: Skin is warm and dry. No rash noted.   Cardiovascular: Normal heart rate noted  Respiratory: Normal respiratory effort, no problems with respiration noted  Abdomen: Soft, gravid, appropriate for gestational age. Pain/Pressure: Present     Pelvic:  {Blank single:19197::"Cervical exam performed","Cervical exam deferred"}        Extremities: Normal range of motion.     Mental Status: Normal mood and affect. Normal behavior. Normal judgment and thought content.   Assessment    26 y.o. G6P5006 at [redacted]w[redacted]d by  12/30/2022, by Ultrasound presenting for {Blank single:19197::"routine","work-in"} prenatal visit  Plan   04-28-2022 Problems (from 04/29/22 to present)     Problem Noted Resolved   Supervision of high risk pregnancy, antepartum 06/28/2022 by Bethanne Ginger, CMA No   Overview Addendum 08/23/2022  5:23 PM by Manya Silvas, [facility] Staff Provider  Office Location  [place] Dating  6  [facility] Model [ ]  Traditional Valu.Nieves ] Centering [ ]  Mom-Baby Dyad    Language  English Anatomy US  Nml  Flu Vaccine   Genetic/Carrier Screen  NIPS:   Low risk AFP:    Horizon:  Alpha thalassemia pos in MOB  TDaP Vaccine    Hgb A1C or  GTT Early : A13 5.3 Third trimester   COVID Vaccine J&J- 2 DOSES   LAB RESULTS   Rhogam   Blood Type O/Positive/-- (08/18 1104)   Baby Feeding Plan Breast Antibody Negative (08/18 1104)  Contraception IUD Rubella 1.38 (08/18 1104)  Circumcision Yes RPR Non Reactive (08/18 1104)   Pediatrician  List given HBsAg Negative (08/18 1104)   Support Person Kevin(FOB) HCVAb  neg  Prenatal Classes  HIV Non Reactive (08/18 1104)     BTL Consent NA GBS   (For PCN allergy, check sensitivities)   VBAC Consent  Pap        DME Rx Valu.Nieves ] BP cuff Valu.Nieves ] Weight Scale Waterbirth  [ ]  Class [ ]  Consent [ ]  CNM visit  PHQ9 & GAD7 [  ] new OB [  ] 28 weeks  [  ] 36 weeks Induction  [ ]  Orders Entered [ ] Foley Y/N            {Blank single:19197::"Term","Preterm"} labor symptoms and general obstetric precautions including but not limited to vaginal bleeding,  contractions, leaking of fluid and fetal movement were reviewed in detail with the patient. Please refer to After Visit Summary for other counseling recommendations.   Return in about 2 weeks (around 11/07/2022) for ROB, With MD!!!! c/s and tubal .  @MYSIGNATURE @

## 2022-10-25 ENCOUNTER — Other Ambulatory Visit: Payer: Self-pay | Admitting: Licensed Practical Nurse

## 2022-10-25 DIAGNOSIS — R7309 Other abnormal glucose: Secondary | ICD-10-CM

## 2022-10-25 DIAGNOSIS — O0930 Supervision of pregnancy with insufficient antenatal care, unspecified trimester: Secondary | ICD-10-CM | POA: Insufficient documentation

## 2022-10-25 LAB — 28 WEEK RH+PANEL
Basophils Absolute: 0 10*3/uL (ref 0.0–0.2)
Basos: 0 %
EOS (ABSOLUTE): 0 10*3/uL (ref 0.0–0.4)
Eos: 0 %
Gestational Diabetes Screen: 151 mg/dL — ABNORMAL HIGH (ref 70–139)
HIV Screen 4th Generation wRfx: NONREACTIVE
Hematocrit: 32.5 % — ABNORMAL LOW (ref 34.0–46.6)
Hemoglobin: 10.8 g/dL — ABNORMAL LOW (ref 11.1–15.9)
Immature Grans (Abs): 0 10*3/uL (ref 0.0–0.1)
Immature Granulocytes: 0 %
Lymphocytes Absolute: 2.2 10*3/uL (ref 0.7–3.1)
Lymphs: 24 %
MCH: 27.3 pg (ref 26.6–33.0)
MCHC: 33.2 g/dL (ref 31.5–35.7)
MCV: 82 fL (ref 79–97)
Monocytes Absolute: 0.4 10*3/uL (ref 0.1–0.9)
Monocytes: 4 %
Neutrophils Absolute: 6.6 10*3/uL (ref 1.4–7.0)
Neutrophils: 72 %
Platelets: 106 10*3/uL — ABNORMAL LOW (ref 150–450)
RBC: 3.96 x10E6/uL (ref 3.77–5.28)
RDW: 14.1 % (ref 11.7–15.4)
RPR Ser Ql: NONREACTIVE
WBC: 9.2 10*3/uL (ref 3.4–10.8)

## 2022-10-25 NOTE — Progress Notes (Signed)
TC to Taylor Velazquez: your 1 hour glucose test is high, you need to come into the office for a 3 hour test. Reviewed how we diagnose GDM, she has never had GDM in her previous pregnancies. Reviewed ften people do not have symptoms of GDM. Please call the office in the am to arrange an apt, it best to do this asap and not wait for your apt on 12/12.   Your hemoglobin is a little low, rec increasing Iron rich foods, she had Anemia in pregnancy in 2016  Your platelets are on the low end, for now we will monitor.   Order placed for 3 hour glucose test  Carie Caddy, CNM   [facility]  10/25/22  5:49 PM

## 2022-11-08 ENCOUNTER — Encounter: Payer: Medicaid Other | Admitting: Obstetrics and Gynecology

## 2022-11-08 ENCOUNTER — Ambulatory Visit (INDEPENDENT_AMBULATORY_CARE_PROVIDER_SITE_OTHER): Payer: Medicaid Other | Admitting: Obstetrics and Gynecology

## 2022-11-08 ENCOUNTER — Encounter: Payer: Self-pay | Admitting: Obstetrics and Gynecology

## 2022-11-08 ENCOUNTER — Ambulatory Visit (INDEPENDENT_AMBULATORY_CARE_PROVIDER_SITE_OTHER): Payer: Medicaid Other

## 2022-11-08 VITALS — BP 103/74 | HR 96 | Wt 245.0 lb

## 2022-11-08 DIAGNOSIS — Z98891 History of uterine scar from previous surgery: Secondary | ICD-10-CM

## 2022-11-08 DIAGNOSIS — O99113 Other diseases of the blood and blood-forming organs and certain disorders involving the immune mechanism complicating pregnancy, third trimester: Secondary | ICD-10-CM | POA: Diagnosis not present

## 2022-11-08 DIAGNOSIS — Z3A34 34 weeks gestation of pregnancy: Secondary | ICD-10-CM | POA: Diagnosis not present

## 2022-11-08 DIAGNOSIS — D649 Anemia, unspecified: Secondary | ICD-10-CM

## 2022-11-08 DIAGNOSIS — Z308 Encounter for other contraceptive management: Secondary | ICD-10-CM

## 2022-11-08 DIAGNOSIS — O0990 Supervision of high risk pregnancy, unspecified, unspecified trimester: Secondary | ICD-10-CM

## 2022-11-08 DIAGNOSIS — O34219 Maternal care for unspecified type scar from previous cesarean delivery: Secondary | ICD-10-CM | POA: Diagnosis not present

## 2022-11-08 DIAGNOSIS — Z23 Encounter for immunization: Secondary | ICD-10-CM | POA: Diagnosis not present

## 2022-11-08 DIAGNOSIS — O0993 Supervision of high risk pregnancy, unspecified, third trimester: Secondary | ICD-10-CM

## 2022-11-08 DIAGNOSIS — Z3A32 32 weeks gestation of pregnancy: Secondary | ICD-10-CM

## 2022-11-08 DIAGNOSIS — O99013 Anemia complicating pregnancy, third trimester: Secondary | ICD-10-CM

## 2022-11-08 DIAGNOSIS — D696 Thrombocytopenia, unspecified: Secondary | ICD-10-CM

## 2022-11-08 LAB — POCT URINALYSIS DIPSTICK OB
Bilirubin, UA: NEGATIVE
Blood, UA: NEGATIVE
Glucose, UA: NEGATIVE
Ketones, UA: NEGATIVE
Leukocytes, UA: NEGATIVE
Nitrite, UA: NEGATIVE
Spec Grav, UA: 1.015 (ref 1.010–1.025)
Urobilinogen, UA: 0.2 E.U./dL
pH, UA: 7 (ref 5.0–8.0)

## 2022-11-08 NOTE — Patient Instructions (Signed)
Tdap (Tetanus, Diphtheria, Pertussis) Vaccine: What You Need to Know 1. Why get vaccinated? Tdap vaccine can prevent tetanus, diphtheria, and pertussis. Diphtheria and pertussis spread from person to person. Tetanus enters the body through cuts or wounds. TETANUS (T) causes painful stiffening of the muscles. Tetanus can lead to serious health problems, including being unable to open the mouth, having trouble swallowing and breathing, or death. DIPHTHERIA (D) can lead to difficulty breathing, heart failure, paralysis, or death. PERTUSSIS (aP), also known as "whooping cough," can cause uncontrollable, violent coughing that makes it hard to breathe, eat, or drink. Pertussis can be extremely serious especially in babies and young children, causing pneumonia, convulsions, brain damage, or death. In teens and adults, it can cause weight loss, loss of bladder control, passing out, and rib fractures from severe coughing. 2. Tdap vaccine Tdap is only for children 7 years and older, adolescents, and adults.  Adolescents should receive a single dose of Tdap, preferably at age 10 or 71 years. Pregnant people should get a dose of Tdap during every pregnancy, preferably during the early part of the third trimester, to help protect the newborn from pertussis. Infants are most at risk for severe, life-threatening complications from pertussis. Adults who have never received Tdap should get a dose of Tdap. Also, adults should receive a booster dose of either Tdap or Td (a different vaccine that protects against tetanus and diphtheria but not pertussis) every 10 years, or after 5 years in the case of a severe or dirty wound or burn. Tdap may be given at the same time as other vaccines. 3. Talk with your health care provider Tell your vaccine provider if the person getting the vaccine: Has had an allergic reaction after a previous dose of any vaccine that protects against tetanus, diphtheria, or pertussis, or has any  severe, life-threatening allergies Has had a coma, decreased level of consciousness, or prolonged seizures within 7 days after a previous dose of any pertussis vaccine (DTP, DTaP, or Tdap) Has seizures or another nervous system problem Has ever had Guillain-Barr Syndrome (also called "GBS") Has had severe pain or swelling after a previous dose of any vaccine that protects against tetanus or diphtheria In some cases, your health care provider may decide to postpone Tdap vaccination until a future visit. People with minor illnesses, such as a cold, may be vaccinated. People who are moderately or severely ill should usually wait until they recover before getting Tdap vaccine.  Your health care provider can give you more information. 4. Risks of a vaccine reaction Pain, redness, or swelling where the shot was given, mild fever, headache, feeling tired, and nausea, vomiting, diarrhea, or stomachache sometimes happen after Tdap vaccination. People sometimes faint after medical procedures, including vaccination. Tell your provider if you feel dizzy or have vision changes or ringing in the ears.  As with any medicine, there is a very remote chance of a vaccine causing a severe allergic reaction, other serious injury, or death. 5. What if there is a serious problem? An allergic reaction could occur after the vaccinated person leaves the clinic. If you see signs of a severe allergic reaction (hives, swelling of the face and throat, difficulty breathing, a fast heartbeat, dizziness, or weakness), call 9-1-1 and get the person to the nearest hospital. For other signs that concern you, call your health care provider.  Adverse reactions should be reported to the Vaccine Adverse Event Reporting System (VAERS). Your health care provider will usually file this report, or you  can do it yourself. Visit the VAERS website at www.vaers.hhs.gov or call 1-800-822-7967. VAERS is only for reporting reactions, and VAERS staff  members do not give medical advice. 6. The National Vaccine Injury Compensation Program The National Vaccine Injury Compensation Program (VICP) is a federal program that was created to compensate people who may have been injured by certain vaccines. Claims regarding alleged injury or death due to vaccination have a time limit for filing, which may be as short as two years. Visit the VICP website at www.hrsa.gov/vaccinecompensation or call 1-800-338-2382 to learn about the program and about filing a claim. 7. How can I learn more? Ask your health care provider. Call your local or state health department. Visit the website of the Food and Drug Administration (FDA) for vaccine package inserts and additional information at www.fda.gov/vaccines-blood-biologics/vaccines. Contact the Centers for Disease Control and Prevention (CDC): Call 1-800-232-4636 (1-800-CDC-INFO) or Visit CDC's website at www.cdc.gov/vaccines. Source: CDC Vaccine Information Statement Tdap (Tetanus, Diphtheria, Pertussis) Vaccine (07/03/2020) This same material is available at www.cdc.gov for no charge. This information is not intended to replace advice given to you by your health care provider. Make sure you discuss any questions you have with your health care provider. Document Revised: 10/12/2021 Document Reviewed: 08/16/2021 Elsevier Patient Education  2023 Elsevier Inc.  

## 2022-11-08 NOTE — Progress Notes (Signed)
ROB 32.4w: She is doing well. TDAP and BTC form done today. She has good fetal movement. No new concerns today.

## 2022-11-08 NOTE — Progress Notes (Unsigned)
ROB: Patient is a 26 y.o. G23P5006 female at [redacted]w[redacted]d.  Pregnancy complicated by history of prior C/S x 5, for MD consultation and pre-op discussion for repeat C-section with tubal ligation.  Recently transferred care from [facility] to [facility] due to relocation. Patient denies major complaints today.   Reviewed previous records in Care Everywhere, noting patient 2 classical C-sections, last C-section was noted to have dense abdominal adhesions. Also with postpartum hemorrhage. Discussion had with patient regarding increased risk of hemorrhage, organ injury. Discussed possibility of not being able to perform desired BTL due to adhesions. Discussed alternatives, patient ok to have Nexplanon if tubal cannot be performed. Medicaid sterilization form signed today. Also discussed risk of placental issues (accreta/percreta) and rare risk of hysterectomy. Will plan for C-section to be performed at 37th week. Patient does note that most of her pregnancies made it to between 36-37 weeks. Will tentatively schedule for week of January 8th. Will need to plan for cell-savers.   Tdap given today. Blood consent form signed. Reviewed labs, mild anemia noted, patient reports she is taking iron supplement. Had elevated 1 hr glucola, needs 3 hr to be scheduled. Thrombocytopenia on 28 week labs (platelets 105), has hx also in previous pregnancy. Will repeat levels at 36 weeks.   F/u ultrasound for incomplete anatomy to be performed today after visit.

## 2022-11-09 ENCOUNTER — Encounter: Payer: Self-pay | Admitting: Obstetrics and Gynecology

## 2022-11-10 ENCOUNTER — Encounter: Payer: Self-pay | Admitting: Obstetrics and Gynecology

## 2022-11-10 DIAGNOSIS — O99013 Anemia complicating pregnancy, third trimester: Secondary | ICD-10-CM | POA: Insufficient documentation

## 2022-11-10 DIAGNOSIS — O99113 Other diseases of the blood and blood-forming organs and certain disorders involving the immune mechanism complicating pregnancy, third trimester: Secondary | ICD-10-CM | POA: Insufficient documentation

## 2022-11-10 DIAGNOSIS — D696 Thrombocytopenia, unspecified: Secondary | ICD-10-CM | POA: Insufficient documentation

## 2022-11-16 ENCOUNTER — Telehealth: Payer: Self-pay

## 2022-11-17 NOTE — Telephone Encounter (Signed)
Patient is scheduled for 12/01/21 for 3 gtt and seeing Dr Valentino Saxon afterward

## 2022-11-24 ENCOUNTER — Other Ambulatory Visit: Payer: Medicaid Other

## 2022-11-24 ENCOUNTER — Encounter: Payer: Medicaid Other | Admitting: Obstetrics and Gynecology

## 2022-11-24 DIAGNOSIS — O0990 Supervision of high risk pregnancy, unspecified, unspecified trimester: Secondary | ICD-10-CM

## 2022-11-24 DIAGNOSIS — Z3A34 34 weeks gestation of pregnancy: Secondary | ICD-10-CM

## 2022-11-28 NOTE — L&D Delivery Note (Signed)
Delivery Summary for Taylor Velazquez  Labor Events:   Preterm labor: No data found  Rupture date: No data found  Rupture time: No data found  Rupture type: No data found  Fluid Color: No data found  Induction: No data found  Augmentation: No data found  Complications: No data found  Cervical ripening: No data found No data found   No data found     Delivery:   Episiotomy: No data found  Lacerations: No data found  Repair suture: No data found  Repair # of packets: No data found  Blood loss (ml): No data found   Information for the patient's newborn:  Yaniah, Thiemann [[id number]]   Delivery 12/06/2022 8:36 AM by  C-Section, Classical Sex:  female Gestational Age: [redacted]w[redacted]d Delivery Clinician:   Living?:         APGARS  One minute Five minutes Ten minutes  Skin color:        Heart rate:        Grimace:        Muscle tone:        Breathing:        Totals: 8  9      Presentation/position:      Resuscitation:   Cord information:    Disposition of cord blood:     Blood gases sent?  Complications:   Placenta: Delivered:       appearance Newborn Measurements: Weight: 7 lb 5.1 oz (3320 g)  Height: 19.69"  Head circumference:    Chest circumference:    Other providers:    Additional  information: Forceps:   Vacuum:   Breech:   Observed anomalies       See Dr. Andreas Blower operative note for details of procedure.    Rubie Maid, MD [facility]

## 2022-12-01 ENCOUNTER — Encounter: Payer: Medicaid Other | Admitting: Obstetrics and Gynecology

## 2022-12-01 ENCOUNTER — Encounter
Admission: RE | Admit: 2022-12-01 | Discharge: 2022-12-01 | Disposition: A | Discharge status: Home / Self Care | Payer: Medicaid Other | Source: Ambulatory Visit | Attending: Obstetrics and Gynecology | Admitting: Obstetrics and Gynecology

## 2022-12-01 ENCOUNTER — Telehealth: Payer: Self-pay | Admitting: Obstetrics and Gynecology

## 2022-12-01 ENCOUNTER — Other Ambulatory Visit: Payer: Medicaid Other

## 2022-12-01 ENCOUNTER — Other Ambulatory Visit: Payer: Self-pay | Admitting: Obstetrics and Gynecology

## 2022-12-01 DIAGNOSIS — Z01818 Encounter for other preprocedural examination: Secondary | ICD-10-CM

## 2022-12-01 HISTORY — DX: Peritoneal adhesions (postprocedural) (postinfection): K66.0

## 2022-12-01 NOTE — Telephone Encounter (Signed)
Reached out to pt to reschedule lab appt and ROB appt that was scheduled on 12/01/2022.  Call could not be completed when I called [phone].  Called a second number and the call was not accepted.  Did send a MyChart message to the pt offering to get the pt worked in today if she was going to deliver with [place]?

## 2022-12-01 NOTE — Patient Instructions (Addendum)
Your procedure is scheduled on: January 9  Arrival Time: Please call Labor and Delivery the day before your scheduled C-Section to find out your arrival time. [phone].  Arrival: If your arrival time is prior to 6:00 am, please enter through the Emergency Room Entrance and you will be directed to Labor and Delivery. If your arrival time is 6:00 am or later, please enter the Medical Mall and follow the greeter's instructions.  REMEMBER: Instructions that are not followed completely may result in serious medical risk, up to and including death; or upon the discretion of your surgeon and anesthesiologist your surgery may need to be rescheduled.  Do not eat food after midnight the night before surgery.  No gum chewing, lozengers or hard candies.  You may however, drink CLEAR liquids up to 2 hours before you are scheduled to arrive for your surgery. Do not drink anything within 2 hours of your scheduled arrival time.  Clear liquids include: - water  - apple juice without pulp - gatorade (not RED colors) - black coffee or tea (Do NOT add milk or creamers to the coffee or tea) Do NOT drink anything that is not on this list.  In addition, your doctor has ordered for you to drink the provided  Ensure Pre-Surgery Clear Carbohydrate Drink  Drinking this carbohydrate drink up to two hours before surgery helps to reduce insulin resistance and improve patient outcomes. Please complete drinking 2 hours prior to scheduled arrival time.  DO NOT TAKE ANY MEDICATIONS THE MORNING OF SURGERY   One week prior to surgery: STARTING TODAY, JANUARY 4 Stop aspirin and Anti-inflammatories (NSAIDS) such as Advil, Aleve, Ibuprofen, Motrin, Naproxen, Naprosyn and Aspirin based products such as Excedrin, Goodys Powder, BC Powder. Stop ANY OVER THE COUNTER supplements until after surgery. You may however, continue to take Tylenol if needed for pain up until the day of surgery.  No Alcohol for 24 hours before or  after surgery.  No Smoking including e-cigarettes for 24 hours prior to surgery.  No chewable tobacco products for at least 6 hours prior to surgery.  No nicotine patches on the day of surgery.  Do not use any "recreational" drugs for at least a week prior to your surgery.  Please be advised that the combination of cocaine and anesthesia may have negative outcomes, up to and including death. If you test positive for cocaine, your surgery will be cancelled.  On the morning of surgery brush your teeth with toothpaste and water, you may rinse your mouth with mouthwash if you wish. Do not swallow any toothpaste or mouthwash.  Use CHG wipes as directed on instruction sheet.  Do not wear jewelry, make-up, hairpins, clips or nail polish.  Do not wear lotions, powders, or perfumes.   Do not shave body from the neck down 48 hours prior to surgery just in case you cut yourself which could leave a site for infection.  Also, freshly shaved skin may become irritated if using the CHG soap.  Contact lenses, hearing aids and dentures may not be worn into surgery.  Do not bring valuables to the hospital. [facility] is not responsible for any missing/lost belongings or valuables.   Notify your doctor if there is any change in your medical condition (cold, fever, infection).  Wear comfortable clothing (specific to your surgery type) to the hospital.  After surgery, you can help prevent lung complications by doing breathing exercises.  Take deep breaths and cough every 1-2 hours. Your doctor may order  a device called an Incentive Spirometer to help you take deep breaths. When coughing or sneezing, hold a pillow firmly against your incision with both hands. This is called "splinting." Doing this helps protect your incision. It also decreases belly discomfort.  Please call the [facility] Dept. at [phone] if you have any questions about these instructions.  Surgery Visitation  Policy:  Support Person  The designated support person is not considered a visitor.  Any inpatient support person will be given a band identifying them as the patient's chosen support person and may stay with the patient around the clock.  Visitor Passes   All visitors, including children, need an identification sticker when visiting. These stickers must be worn where they can be seen.   Labor & Delivery  Laboring women of any age may have one designated support person and one other visitor aged 69 and older at a time, and a doula registered with [facility] for the labor and delivery phase of their stay. (Doulas not registered with [facility] are counted as visitors.) The visitor may switch with other visitors. Visitation is allowed 24 hours per day.  No children under the age of 55. The designated support person or a visitor may stay overnight in the room.  Mother Baby Unit, OB Specialty and Gynecological Care  A designated support person and three other visitors of any age may visit. The three visitors may switch out. The designated support person or a visitor aged 2 or older may stay overnight in the room. During the postpartum period (up to 6 weeks), if the mother is the patient, she can have her newborn stay with her if there is another support person present who can be responsible for the baby.   MASKING: Due to an increase in RSV rates and hospitalizations, starting Wednesday, Nov. 15, in patient care areas in which we serve newborns, infants and children, masks will be required for teammates and visitors.  Children ages 73 and under may not visit. This policy affects the following departments only:  [facility] Postpartum area Mother Baby Unit Newborn nursery/Special care nursery  Other areas: Masks continue to be strongly recommended for [facility] teammates, visitors and patients in all other areas. Visitation is not restricted outside of the  units listed above.  Preparing the Skin Before Surgery     To help prevent the risk of infection at your surgical site, we are now providing you with rinse-free Sage 2% Chlorhexidine Gluconate (CHG) disposable wipes.  Chlorhexidine Gluconate (CHG) Soap  o An antiseptic cleaner that kills germs and bonds with the skin to continue killing germs even after washing  o Used for showering the night before surgery and morning of surgery  The night before surgery: Shower or bathe with warm water. Do not apply perfume, lotions, powders. Wait one hour after shower. Skin should be dry and cool. Open Sage wipe package - 6 disposable cloths are inside. Wipe body using one cloth for the right arm, one cloth for the left arm, one cloth for the right leg, one cloth for the left leg, one cloth for the chest/abdomen area (do not use on breasts if breast feeding), and one cloth for the back. 5. Do not rinse, allow to dry. 6. Skin may fee "tacky" for several minutes. 7. Dress in clean clothes. 8. Place clean sheets on your bed and do not sleep with pets.  REPEAT ABOVE ON THE MORNING OF SURGERY PRIOR TO ARRIVING TO  THE HOSPITAL.

## 2022-12-02 NOTE — Telephone Encounter (Signed)
I contact patient about rescheduling appointment for 3 gtt. Patient states she was advise by someone that per Dr. Marcelline Mates patient didn't need this lab because patient is scheduled for her C-section for Tuesday, 1/9 and her results would be back by then

## 2022-12-02 NOTE — Telephone Encounter (Signed)
Reached out to pt to reschedule appts that were scheduled for 12/01/22-lab appt and ROB appt.  Pt was rescheduled for 12/05/2022 with MMF at 10:35.  Per Dr. Marcelline Mates pt did not need to do the 3 hr glucose test since she has cs scheduled for 12/06/2022.

## 2022-12-05 ENCOUNTER — Encounter: Payer: Self-pay | Admitting: Urgent Care

## 2022-12-05 ENCOUNTER — Other Ambulatory Visit: Payer: Medicaid Other

## 2022-12-05 ENCOUNTER — Encounter
Admission: RE | Admit: 2022-12-05 | Discharge: 2022-12-05 | Disposition: A | Discharge status: Home / Self Care | Payer: Medicaid Other | Source: Ambulatory Visit | Attending: Obstetrics and Gynecology | Admitting: Obstetrics and Gynecology

## 2022-12-05 ENCOUNTER — Inpatient Hospital Stay: Admission: RE | Admit: 2022-12-05 | Payer: Medicaid Other | Source: Ambulatory Visit

## 2022-12-05 ENCOUNTER — Encounter: Payer: Self-pay | Admitting: Obstetrics

## 2022-12-05 ENCOUNTER — Ambulatory Visit (INDEPENDENT_AMBULATORY_CARE_PROVIDER_SITE_OTHER): Payer: Medicaid Other | Admitting: Obstetrics

## 2022-12-05 ENCOUNTER — Other Ambulatory Visit (HOSPITAL_COMMUNITY)
Admission: RE | Admit: 2022-12-05 | Discharge: 2022-12-05 | Disposition: A | Discharge status: Home / Self Care | Payer: Medicaid Other | Source: Ambulatory Visit | Attending: Obstetrics and Gynecology | Admitting: Obstetrics and Gynecology

## 2022-12-05 VITALS — BP 114/75 | HR 92 | Wt 248.9 lb

## 2022-12-05 DIAGNOSIS — Z3A36 36 weeks gestation of pregnancy: Secondary | ICD-10-CM | POA: Insufficient documentation

## 2022-12-05 DIAGNOSIS — R739 Hyperglycemia, unspecified: Secondary | ICD-10-CM

## 2022-12-05 DIAGNOSIS — Z3483 Encounter for supervision of other normal pregnancy, third trimester: Secondary | ICD-10-CM | POA: Diagnosis not present

## 2022-12-05 DIAGNOSIS — Z01812 Encounter for preprocedural laboratory examination: Secondary | ICD-10-CM | POA: Insufficient documentation

## 2022-12-05 DIAGNOSIS — O0990 Supervision of high risk pregnancy, unspecified, unspecified trimester: Secondary | ICD-10-CM | POA: Insufficient documentation

## 2022-12-05 DIAGNOSIS — Z113 Encounter for screening for infections with a predominantly sexual mode of transmission: Secondary | ICD-10-CM | POA: Insufficient documentation

## 2022-12-05 DIAGNOSIS — Z01818 Encounter for other preprocedural examination: Secondary | ICD-10-CM

## 2022-12-05 DIAGNOSIS — Z3685 Encounter for antenatal screening for Streptococcus B: Secondary | ICD-10-CM

## 2022-12-05 LAB — CBC
HCT: 33.2 % — ABNORMAL LOW (ref 36.0–46.0)
Hemoglobin: 10.5 g/dL — ABNORMAL LOW (ref 12.0–15.0)
MCH: 26.4 pg (ref 26.0–34.0)
MCHC: 31.6 g/dL (ref 30.0–36.0)
MCV: 83.4 fL (ref 80.0–100.0)
Platelets: 109 10*3/uL — ABNORMAL LOW (ref 150–400)
RBC: 3.98 MIL/uL (ref 3.87–5.11)
RDW: 14.6 % (ref 11.5–15.5)
WBC: 8.3 10*3/uL (ref 4.0–10.5)
nRBC: 0 % (ref 0.0–0.2)

## 2022-12-05 LAB — POCT URINALYSIS DIPSTICK OB
Bilirubin, UA: NEGATIVE
Blood, UA: NEGATIVE
Glucose, UA: NEGATIVE
Ketones, UA: NEGATIVE
Leukocytes, UA: NEGATIVE
Nitrite, UA: NEGATIVE
Spec Grav, UA: 1.025 (ref 1.010–1.025)
Urobilinogen, UA: 0.2 E.U./dL
pH, UA: 6 (ref 5.0–8.0)

## 2022-12-05 LAB — RAPID HIV SCREEN (HIV 1/2 AB+AG)
HIV 1/2 Antibodies: NONREACTIVE
HIV-1 P24 Antigen - HIV24: NONREACTIVE

## 2022-12-05 LAB — GLUCOSE, POCT (MANUAL RESULT ENTRY): POC Glucose: 101 mg/dl — AB (ref 70–99)

## 2022-12-05 NOTE — Progress Notes (Signed)
Routine Prenatal Care Visit  Subjective  Taylor Velazquez is a 27 y.o. G6P5006 at [redacted]w[redacted]d being seen today for ongoing prenatal care.  She is currently monitored for the following issues for this high-risk pregnancy and has Supervision of high risk pregnancy, antepartum; Previous cesarean delivery affecting pregnancy, antepartum; History of gestational hypertension; Alpha thalassemia silent carrier; Red Chart Rounds Patient; History of postpartum hemorrhage; Late prenatal care affecting pregnancy; Benign gestational thrombocytopenia in third trimester [facility]); and Anemia of pregnancy in third trimester on their problem list.  ----------------------------------------------------------------------------------- Patient reports no complaints.   Contractions: Not present. Vag. Bleeding: None.  Movement: Present. Leaking Fluid denies.  ----------------------------------------------------------------------------------- The following portions of the patient's history were reviewed and updated as appropriate: allergies, current medications, past family history, past medical history, past social history, past surgical history and problem list. Problem list updated.  Objective  Blood pressure 114/75, pulse 92, weight 248 lb 14.4 oz (112.9 kg), last menstrual period 04/02/2022. Pregravid weight 235 lb (106.6 kg) Total Weight Gain 13 lb 14.4 oz (6.305 kg) Urinalysis: Urine Protein Moderate (2+)  Urine Glucose Negative  Fetal Status: Fetal Heart Rate (bpm): 146 Fundal Height: 41 cm Movement: Present     General:  Alert, oriented and cooperative. Patient is in no acute distress.  Skin: Skin is warm and dry. No rash noted.   Cardiovascular: Normal heart rate noted  Respiratory: Normal respiratory effort, no problems with respiration noted  Abdomen: Soft, gravid, appropriate for gestational age. Pain/Pressure: Present     Pelvic:  Cervical exam deferred        Extremities: Normal range of motion.  Edema: Trace   Mental Status: Normal mood and affect. Normal behavior. Normal judgment and thought content.   Assessment   27 y.o. G6P5006 at [redacted]w[redacted]d by  12/30/2022, by Ultrasound presenting for routine prenatal visit  Plan   04-28-2022 Problems (from 04/29/22 to present)     Problem Noted Resolved   Supervision of high risk pregnancy, antepartum 06/28/2022 by Bethanne Ginger, CMA No   Overview Addendum 12/05/2022 11:12 AM by Imagene Riches, CNM     Nursing Staff Provider  Office Location  [place] Dating  6  [facility] Model [ ]  Traditional Valu.Nieves ] Centering [ ]  Mom-Baby Dyad    Language  English Anatomy US  Nml  Flu Vaccine   Genetic/Carrier Screen  NIPS:   Low risk AFP:    Horizon:  Alpha thalassemia pos in MOB  TDaP Vaccine    Hgb A1C or  GTT Early : A13 5.3 Third trimester 151. Never did her 3 hour!  COVID Vaccine J&J- 2 DOSES   LAB RESULTS   Rhogam   Blood Type O/Positive/-- (08/18 1104)   Baby Feeding Plan Breast Antibody Negative (08/18 1104)  Contraception IUD Rubella 1.38 (08/18 1104)  Circumcision Yes RPR Non Reactive (08/18 1104)   Pediatrician  List given HBsAg Negative (08/18 1104)   Support Person Kevin(FOB) HCVAb  neg  Prenatal Classes  HIV Non Reactive (08/18 1104)     BTL Consent NA GBS   (For PCN allergy, check sensitivities)   VBAC Consent  Pap        DME Rx Valu.Nieves ] BP cuff Valu.Nieves ] Weight Scale Waterbirth  [ ]  Class [ ]  Consent [ ]  CNM visit  PHQ9 & GAD7 [  ] new OB [  ] 28 weeks  [  ] 36 weeks Induction  [ ]  Orders Entered [ ] Foley Y/N  Term labor symptoms and general obstetric precautions including but not limited to vaginal bleeding, contractions, leaking of fluid and fetal movement were reviewed in detail with the patient. Please refer to After Visit Summary for other counseling recommendations.  Discussed her CS and possible tubal tomorrow. Educated re her noncompliance re the need for 3 hr testing.she understands that her baby may have glucose issues post delivery,  and may require additional help if she was actually GDM.  She knows to be at the hospital at 0500. Has lab draw at [facility] today. Still desires tubal, if it can be done. NPO after midnight.  Return if symptoms worsen or fail to improve, for having repeat CS tomorrow.Mirna Mires, CNM  12/05/2022 11:25 AM

## 2022-12-05 NOTE — Progress Notes (Signed)
ROB. Patient states daily fetal movement along with pain and pressure. She is currently scheduled for a  repeat cesarean delivery tomorrow. GC/CT and GBS cultures ordered. Patient states no questions or concerns at this time.

## 2022-12-05 NOTE — Addendum Note (Signed)
Addended by: Douglass Rivers R on: 12/05/2022 11:36 AM   Modules accepted: Orders

## 2022-12-06 ENCOUNTER — Inpatient Hospital Stay
Admission: RE | Admit: 2022-12-06 | Discharge: 2022-12-08 | DRG: 787 | Disposition: A | Discharge status: Home / Self Care | Payer: Medicaid Other | Attending: Obstetrics and Gynecology | Admitting: Obstetrics and Gynecology

## 2022-12-06 ENCOUNTER — Inpatient Hospital Stay: Payer: Medicaid Other | Admitting: Certified Registered Nurse Anesthetist

## 2022-12-06 ENCOUNTER — Encounter
Admission: RE | Disposition: A | Discharge status: Home / Self Care | Payer: Self-pay | Source: Home / Self Care | Attending: Obstetrics and Gynecology

## 2022-12-06 ENCOUNTER — Encounter: Payer: Self-pay | Admitting: Obstetrics and Gynecology

## 2022-12-06 ENCOUNTER — Other Ambulatory Visit: Payer: Self-pay

## 2022-12-06 DIAGNOSIS — O34219 Maternal care for unspecified type scar from previous cesarean delivery: Secondary | ICD-10-CM | POA: Diagnosis present

## 2022-12-06 DIAGNOSIS — Z01818 Encounter for other preprocedural examination: Secondary | ICD-10-CM

## 2022-12-06 DIAGNOSIS — O0933 Supervision of pregnancy with insufficient antenatal care, third trimester: Secondary | ICD-10-CM

## 2022-12-06 DIAGNOSIS — Z30430 Encounter for insertion of intrauterine contraceptive device: Secondary | ICD-10-CM | POA: Diagnosis not present

## 2022-12-06 DIAGNOSIS — D696 Thrombocytopenia, unspecified: Secondary | ICD-10-CM

## 2022-12-06 DIAGNOSIS — O99113 Other diseases of the blood and blood-forming organs and certain disorders involving the immune mechanism complicating pregnancy, third trimester: Secondary | ICD-10-CM

## 2022-12-06 DIAGNOSIS — O99810 Abnormal glucose complicating pregnancy: Secondary | ICD-10-CM | POA: Diagnosis present

## 2022-12-06 DIAGNOSIS — Z3A36 36 weeks gestation of pregnancy: Secondary | ICD-10-CM

## 2022-12-06 DIAGNOSIS — O34211 Maternal care for low transverse scar from previous cesarean delivery: Secondary | ICD-10-CM

## 2022-12-06 DIAGNOSIS — D6959 Other secondary thrombocytopenia: Secondary | ICD-10-CM | POA: Diagnosis present

## 2022-12-06 DIAGNOSIS — O34212 Maternal care for vertical scar from previous cesarean delivery: Secondary | ICD-10-CM | POA: Diagnosis present

## 2022-12-06 DIAGNOSIS — Z148 Genetic carrier of other disease: Secondary | ICD-10-CM | POA: Diagnosis not present

## 2022-12-06 DIAGNOSIS — O9902 Anemia complicating childbirth: Secondary | ICD-10-CM | POA: Diagnosis present

## 2022-12-06 DIAGNOSIS — O9912 Other diseases of the blood and blood-forming organs and certain disorders involving the immune mechanism complicating childbirth: Secondary | ICD-10-CM | POA: Diagnosis present

## 2022-12-06 DIAGNOSIS — Z7982 Long term (current) use of aspirin: Secondary | ICD-10-CM | POA: Diagnosis not present

## 2022-12-06 DIAGNOSIS — D649 Anemia, unspecified: Secondary | ICD-10-CM

## 2022-12-06 DIAGNOSIS — O0990 Supervision of high risk pregnancy, unspecified, unspecified trimester: Secondary | ICD-10-CM

## 2022-12-06 DIAGNOSIS — O99013 Anemia complicating pregnancy, third trimester: Principal | ICD-10-CM

## 2022-12-06 DIAGNOSIS — O0930 Supervision of pregnancy with insufficient antenatal care, unspecified trimester: Secondary | ICD-10-CM

## 2022-12-06 LAB — GLUCOSE, CAPILLARY: Glucose-Capillary: 93 mg/dL (ref 70–99)

## 2022-12-06 LAB — CBC
HCT: 33.6 % — ABNORMAL LOW (ref 36.0–46.0)
Hemoglobin: 10.6 g/dL — ABNORMAL LOW (ref 12.0–15.0)
MCH: 26.3 pg (ref 26.0–34.0)
MCHC: 31.5 g/dL (ref 30.0–36.0)
MCV: 83.4 fL (ref 80.0–100.0)
Platelets: 109 10*3/uL — ABNORMAL LOW (ref 150–400)
RBC: 4.03 MIL/uL (ref 3.87–5.11)
RDW: 14.8 % (ref 11.5–15.5)
WBC: 9.4 10*3/uL (ref 4.0–10.5)
nRBC: 0 % (ref 0.0–0.2)

## 2022-12-06 LAB — GROUP B STREP BY PCR: Group B strep by PCR: NEGATIVE

## 2022-12-06 LAB — CERVICOVAGINAL ANCILLARY ONLY
Chlamydia: NEGATIVE
Comment: NEGATIVE
Comment: NORMAL
Neisseria Gonorrhea: NEGATIVE

## 2022-12-06 LAB — RPR: RPR Ser Ql: NONREACTIVE

## 2022-12-06 SURGERY — Surgical Case
Anesthesia: Spinal

## 2022-12-06 MED ORDER — PHENYLEPHRINE HCL (PRESSORS) 10 MG/ML IV SOLN
INTRAVENOUS | Status: AC
  Filled 2022-12-06: qty 1

## 2022-12-06 MED ORDER — SODIUM CHLORIDE FLUSH 0.9 % IV SOLN
INTRAVENOUS | Status: DC | PRN
  Administered 2022-12-06: 100 mL

## 2022-12-06 MED ORDER — SODIUM CHLORIDE (PF) 0.9 % IJ SOLN
INTRAMUSCULAR | Status: AC
  Filled 2022-12-06: qty 50

## 2022-12-06 MED ORDER — BUPIVACAINE HCL 0.5 % IJ SOLN: INTRAMUSCULAR | Status: DC | PRN

## 2022-12-06 MED ORDER — ACETAMINOPHEN 500 MG PO TABS
1000.0000 mg | ORAL_TABLET | Freq: Four times a day (QID) | ORAL | Status: DC
  Filled 2022-12-06: qty 2

## 2022-12-06 MED ORDER — TETANUS-DIPHTH-ACELL PERTUSSIS 5-2.5-18.5 LF-MCG/0.5 IM SUSY: 0.5000 mL | PREFILLED_SYRINGE | Freq: Once | INTRAMUSCULAR | Status: DC

## 2022-12-06 MED ORDER — SENNOSIDES-DOCUSATE SODIUM 8.6-50 MG PO TABS
2.0000 | ORAL_TABLET | Freq: Every day | ORAL | Status: DC
  Administered 2022-12-07 – 2022-12-08 (×2): 2 via ORAL
  Filled 2022-12-06 (×2): qty 2

## 2022-12-06 MED ORDER — SIMETHICONE 80 MG PO CHEW
80.0000 mg | CHEWABLE_TABLET | ORAL | Status: DC | PRN
  Administered 2022-12-07 – 2022-12-08 (×3): 80 mg via ORAL
  Filled 2022-12-06 (×3): qty 1

## 2022-12-06 MED ORDER — ACETAMINOPHEN 500 MG PO TABS
1000.0000 mg | ORAL_TABLET | Freq: Four times a day (QID) | ORAL | Status: AC
  Administered 2022-12-06 – 2022-12-07 (×3): 1000 mg via ORAL
  Filled 2022-12-06 (×3): qty 2

## 2022-12-06 MED ORDER — OXYTOCIN-SODIUM CHLORIDE 30-0.9 UT/500ML-% IV SOLN: 2.5000 [IU]/h | INTRAVENOUS | Status: AC

## 2022-12-06 MED ORDER — LACTATED RINGERS IV SOLN: Freq: Once | INTRAVENOUS | Status: AC

## 2022-12-06 MED ORDER — BUPIVACAINE HCL (PF) 0.5 % IJ SOLN
INTRAMUSCULAR | Status: AC
  Filled 2022-12-06: qty 30

## 2022-12-06 MED ORDER — BUPIVACAINE LIPOSOME 1.3 % IJ SUSP
20.0000 mL | Freq: Once | INTRAMUSCULAR | Status: DC
  Filled 2022-12-06: qty 20

## 2022-12-06 MED ORDER — OXYTOCIN-SODIUM CHLORIDE 30-0.9 UT/500ML-% IV SOLN
INTRAVENOUS | Status: DC | PRN
  Administered 2022-12-06: 500 mL via INTRAVENOUS

## 2022-12-06 MED ORDER — NALOXONE HCL 4 MG/10ML IJ SOLN
1.0000 ug/kg/h | INTRAVENOUS | Status: DC | PRN
  Filled 2022-12-06: qty 5

## 2022-12-06 MED ORDER — HYDROMORPHONE HCL 1 MG/ML IJ SOLN: 1.0000 mg | INTRAMUSCULAR | Status: DC | PRN

## 2022-12-06 MED ORDER — GABAPENTIN 300 MG PO CAPS
300.0000 mg | ORAL_CAPSULE | ORAL | Status: AC
  Administered 2022-12-06: 300 mg via ORAL
  Filled 2022-12-06: qty 1

## 2022-12-06 MED ORDER — WITCH HAZEL-GLYCERIN EX PADS: 1.0000 | MEDICATED_PAD | CUTANEOUS | Status: DC | PRN

## 2022-12-06 MED ORDER — PRENATAL MULTIVITAMIN CH
1.0000 | ORAL_TABLET | Freq: Every day | ORAL | Status: DC
  Administered 2022-12-06 – 2022-12-08 (×3): 1 via ORAL
  Filled 2022-12-06 (×3): qty 1

## 2022-12-06 MED ORDER — PROPOFOL 10 MG/ML IV BOLUS
INTRAVENOUS | Status: AC
  Filled 2022-12-06: qty 20

## 2022-12-06 MED ORDER — NALOXONE HCL 0.4 MG/ML IJ SOLN: 0.4000 mg | INTRAMUSCULAR | Status: DC | PRN

## 2022-12-06 MED ORDER — DEXAMETHASONE SODIUM PHOSPHATE 10 MG/ML IJ SOLN
INTRAMUSCULAR | Status: DC | PRN
  Administered 2022-12-06: 10 mg via INTRAVENOUS

## 2022-12-06 MED ORDER — CARBOPROST TROMETHAMINE 250 MCG/ML IM SOLN
INTRAMUSCULAR | Status: AC
  Filled 2022-12-06: qty 1

## 2022-12-06 MED ORDER — COCONUT OIL OIL: 1.0000 | TOPICAL_OIL | Status: DC | PRN

## 2022-12-06 MED ORDER — MORPHINE SULFATE (PF) 0.5 MG/ML IJ SOLN
INTRAMUSCULAR | Status: AC
  Filled 2022-12-06: qty 10

## 2022-12-06 MED ORDER — KETOROLAC TROMETHAMINE 30 MG/ML IJ SOLN
30.0000 mg | Freq: Four times a day (QID) | INTRAMUSCULAR | Status: AC
  Administered 2022-12-06 – 2022-12-07 (×4): 30 mg via INTRAVENOUS
  Filled 2022-12-06 (×4): qty 1

## 2022-12-06 MED ORDER — LACTATED RINGERS IV SOLN: INTRAVENOUS | Status: DC

## 2022-12-06 MED ORDER — DEXMEDETOMIDINE HCL IN NACL 80 MCG/20ML IV SOLN
INTRAVENOUS | Status: DC | PRN
  Administered 2022-12-06 (×2): 8 ug via INTRAVENOUS

## 2022-12-06 MED ORDER — OXYCODONE HCL 5 MG PO TABS
5.0000 mg | ORAL_TABLET | ORAL | Status: DC | PRN
  Administered 2022-12-07 – 2022-12-08 (×7): 10 mg via ORAL
  Filled 2022-12-06: qty 2
  Filled 2022-12-06: qty 1
  Filled 2022-12-06 (×6): qty 2

## 2022-12-06 MED ORDER — ORAL CARE MOUTH RINSE: 15.0000 mL | Freq: Once | OROMUCOSAL | Status: AC

## 2022-12-06 MED ORDER — LIDOCAINE HCL (PF) 1 % IJ SOLN
INTRAMUSCULAR | Status: DC | PRN
  Administered 2022-12-06: 3 mL

## 2022-12-06 MED ORDER — GABAPENTIN 300 MG PO CAPS
300.0000 mg | ORAL_CAPSULE | Freq: Two times a day (BID) | ORAL | Status: DC
  Administered 2022-12-06 – 2022-12-08 (×5): 300 mg via ORAL
  Filled 2022-12-06 (×5): qty 1

## 2022-12-06 MED ORDER — MENTHOL 3 MG MT LOZG: 1.0000 | LOZENGE | OROMUCOSAL | Status: DC | PRN

## 2022-12-06 MED ORDER — DIPHENHYDRAMINE HCL 50 MG/ML IJ SOLN: 12.5000 mg | INTRAMUSCULAR | Status: DC | PRN

## 2022-12-06 MED ORDER — DEXAMETHASONE SODIUM PHOSPHATE 10 MG/ML IJ SOLN
INTRAMUSCULAR | Status: AC
  Filled 2022-12-06: qty 1

## 2022-12-06 MED ORDER — DIPHENHYDRAMINE HCL 25 MG PO CAPS: 25.0000 mg | ORAL_CAPSULE | Freq: Four times a day (QID) | ORAL | Status: DC | PRN

## 2022-12-06 MED ORDER — ONDANSETRON HCL 4 MG/2ML IJ SOLN
INTRAMUSCULAR | Status: AC
  Filled 2022-12-06: qty 2

## 2022-12-06 MED ORDER — SODIUM CHLORIDE 0.9% FLUSH: 3.0000 mL | INTRAVENOUS | Status: DC | PRN

## 2022-12-06 MED ORDER — DIPHENHYDRAMINE HCL 25 MG PO CAPS: 25.0000 mg | ORAL_CAPSULE | ORAL | Status: DC | PRN

## 2022-12-06 MED ORDER — SOD CITRATE-CITRIC ACID 500-334 MG/5ML PO SOLN
30.0000 mL | ORAL | Status: AC
  Administered 2022-12-06: 30 mL via ORAL
  Filled 2022-12-06: qty 30

## 2022-12-06 MED ORDER — FENTANYL CITRATE (PF) 100 MCG/2ML IJ SOLN
INTRAMUSCULAR | Status: DC | PRN
  Administered 2022-12-06: 15 ug via INTRATHECAL

## 2022-12-06 MED ORDER — FERROUS SULFATE 325 (65 FE) MG PO TABS
325.0000 mg | ORAL_TABLET | ORAL | Status: DC
  Administered 2022-12-07: 325 mg via ORAL
  Filled 2022-12-06 (×2): qty 1

## 2022-12-06 MED ORDER — SODIUM CHLORIDE 0.9% FLUSH: INTRAVENOUS | Status: DC | PRN

## 2022-12-06 MED ORDER — CEFAZOLIN SODIUM-DEXTROSE 2-4 GM/100ML-% IV SOLN
2.0000 g | INTRAVENOUS | Status: AC
  Administered 2022-12-06: 2 g via INTRAVENOUS
  Filled 2022-12-06: qty 100

## 2022-12-06 MED ORDER — OXYCODONE HCL 5 MG PO TABS
5.0000 mg | ORAL_TABLET | Freq: Four times a day (QID) | ORAL | Status: DC | PRN
  Administered 2022-12-06 – 2022-12-07 (×4): 5 mg via ORAL
  Filled 2022-12-06 (×3): qty 1

## 2022-12-06 MED ORDER — DIPHENHYDRAMINE HCL 50 MG/ML IJ SOLN
INTRAMUSCULAR | Status: AC
  Filled 2022-12-06: qty 1

## 2022-12-06 MED ORDER — PHENYLEPHRINE HCL-NACL 20-0.9 MG/250ML-% IV SOLN
INTRAVENOUS | Status: DC | PRN
  Administered 2022-12-06: 40 ug/min via INTRAVENOUS

## 2022-12-06 MED ORDER — POVIDONE-IODINE 10 % EX SWAB
2.0000 | Freq: Once | CUTANEOUS | Status: AC
  Administered 2022-12-06: 2 via TOPICAL

## 2022-12-06 MED ORDER — TRANEXAMIC ACID-NACL 1000-0.7 MG/100ML-% IV SOLN
INTRAVENOUS | Status: AC
  Filled 2022-12-06: qty 100

## 2022-12-06 MED ORDER — BUPIVACAINE LIPOSOME 1.3 % IJ SUSP
INTRAMUSCULAR | Status: AC
  Filled 2022-12-06: qty 20

## 2022-12-06 MED ORDER — CHLORHEXIDINE GLUCONATE 0.12 % MT SOLN
15.0000 mL | Freq: Once | OROMUCOSAL | Status: AC
  Administered 2022-12-06: 15 mL via OROMUCOSAL
  Filled 2022-12-06: qty 15

## 2022-12-06 MED ORDER — DIBUCAINE (PERIANAL) 1 % EX OINT: 1.0000 | TOPICAL_OINTMENT | CUTANEOUS | Status: DC | PRN

## 2022-12-06 MED ORDER — ONDANSETRON HCL 4 MG/2ML IJ SOLN: 4.0000 mg | Freq: Three times a day (TID) | INTRAMUSCULAR | Status: DC | PRN

## 2022-12-06 MED ORDER — ONDANSETRON HCL 4 MG/2ML IJ SOLN
INTRAMUSCULAR | Status: DC | PRN
  Administered 2022-12-06: 4 mg via INTRAVENOUS

## 2022-12-06 MED ORDER — BUPIVACAINE IN DEXTROSE 0.75-8.25 % IT SOLN
INTRATHECAL | Status: DC | PRN
  Administered 2022-12-06: 1.6 mL via INTRATHECAL

## 2022-12-06 MED ORDER — FENTANYL CITRATE (PF) 100 MCG/2ML IJ SOLN
INTRAMUSCULAR | Status: DC | PRN
  Administered 2022-12-06: 35 ug via INTRAVENOUS
  Administered 2022-12-06: 50 ug via INTRAVENOUS

## 2022-12-06 MED ORDER — MEPERIDINE HCL 25 MG/ML IJ SOLN: 6.2500 mg | INTRAMUSCULAR | Status: DC | PRN

## 2022-12-06 MED ORDER — LEVONORGESTREL 20 MCG/DAY IU IUD
1.0000 | INTRAUTERINE_SYSTEM | Freq: Once | INTRAUTERINE | Status: AC
  Administered 2022-12-06: 1 via INTRAUTERINE
  Filled 2022-12-06: qty 1

## 2022-12-06 MED ORDER — METHYLERGONOVINE MALEATE 0.2 MG/ML IJ SOLN
INTRAMUSCULAR | Status: AC
  Filled 2022-12-06: qty 1

## 2022-12-06 MED ORDER — OXYCODONE-ACETAMINOPHEN 5-325 MG PO TABS
2.0000 | ORAL_TABLET | ORAL | Status: DC | PRN
  Filled 2022-12-06: qty 2

## 2022-12-06 MED ORDER — IBUPROFEN 600 MG PO TABS
600.0000 mg | ORAL_TABLET | Freq: Four times a day (QID) | ORAL | Status: DC
  Administered 2022-12-07 – 2022-12-08 (×3): 600 mg via ORAL
  Filled 2022-12-06 (×3): qty 1

## 2022-12-06 MED ORDER — MORPHINE SULFATE (PF) 0.5 MG/ML IJ SOLN
INTRAMUSCULAR | Status: DC | PRN
  Administered 2022-12-06: 100 ug via INTRATHECAL

## 2022-12-06 MED ORDER — ACETAMINOPHEN 500 MG PO TABS
1000.0000 mg | ORAL_TABLET | ORAL | Status: AC
  Administered 2022-12-06: 1000 mg via ORAL
  Filled 2022-12-06: qty 2

## 2022-12-06 MED ORDER — SCOPOLAMINE 1 MG/3DAYS TD PT72
1.0000 | MEDICATED_PATCH | Freq: Once | TRANSDERMAL | Status: DC
  Administered 2022-12-06: 1.5 mg via TRANSDERMAL
  Filled 2022-12-06: qty 1

## 2022-12-06 MED ORDER — FENTANYL CITRATE (PF) 100 MCG/2ML IJ SOLN
INTRAMUSCULAR | Status: AC
  Filled 2022-12-06: qty 2

## 2022-12-06 MED ORDER — MAGNESIUM HYDROXIDE 400 MG/5ML PO SUSP: 30.0000 mL | ORAL | Status: DC | PRN

## 2022-12-06 MED ORDER — DIPHENHYDRAMINE HCL 50 MG/ML IJ SOLN
INTRAMUSCULAR | Status: DC | PRN
  Administered 2022-12-06 (×2): 12.5 mg via INTRAVENOUS

## 2022-12-06 MED ORDER — ZOLPIDEM TARTRATE 5 MG PO TABS: 5.0000 mg | ORAL_TABLET | Freq: Every evening | ORAL | Status: DC | PRN

## 2022-12-06 SURGICAL SUPPLY — 45 items
BAG BLOOD CELL SAVER (MISCELLANEOUS) IMPLANT
BAG COUNTER SPONGE SURGICOUNT (BAG) ×1 IMPLANT
BAGS BLOOD CELL SAVER (MISCELLANEOUS) ×1 IMPLANT
BARRIER ADHS 3X4 INTERCEED (GAUZE/BANDAGES/DRESSINGS) IMPLANT
BNDG TENSOPLAST 6X5 (GAUZE/BANDAGES/DRESSINGS) IMPLANT
CELL SAVER AUTOCOAGULATE (MISCELLANEOUS) ×1
CELL SAVER BLD BAGS (MISCELLANEOUS) ×1
CHLORAPREP W/TINT 26 (MISCELLANEOUS) ×2 IMPLANT
CLOSURE STERI STRIP 1/2 X4 (GAUZE/BANDAGES/DRESSINGS) IMPLANT
DRSG PAD ABDOMINAL 8X10 ST (GAUZE/BANDAGES/DRESSINGS) IMPLANT
DRSG TELFA 3X8 NADH STRL (GAUZE/BANDAGES/DRESSINGS) ×1 IMPLANT
ELECT REM PT RETURN 9FT ADLT (ELECTROSURGICAL) ×1
ELECTRODE REM PT RTRN 9FT ADLT (ELECTROSURGICAL) ×1 IMPLANT
EXTRT SYSTEM ALEXIS 17CM (MISCELLANEOUS)
GAUZE SPONGE 4X4 12PLY STRL (GAUZE/BANDAGES/DRESSINGS) ×1 IMPLANT
GLOVE BIO SURGEON STRL SZ 6.5 (GLOVE) ×1 IMPLANT
GLOVE SURG UNDER LTX SZ7 (GLOVE) ×1 IMPLANT
GOWN STRL REUS W/ TWL LRG LVL3 (GOWN DISPOSABLE) ×2 IMPLANT
GOWN STRL REUS W/TWL LRG LVL3 (GOWN DISPOSABLE) ×2
HEMOSTAT ARISTA ABSORB 3G PWDR (HEMOSTASIS) IMPLANT
KIT TURNOVER KIT A (KITS) ×1 IMPLANT
MANIFOLD NEPTUNE II (INSTRUMENTS) ×1 IMPLANT
MAT PREVALON FULL STRYKER (MISCELLANEOUS) ×1 IMPLANT
NS IRRIG 1000ML POUR BTL (IV SOLUTION) ×1 IMPLANT
PACK C SECTION AR (MISCELLANEOUS) ×1 IMPLANT
PAD OB MATERNITY 4.3X12.25 (PERSONAL CARE ITEMS) ×1 IMPLANT
PAD PREP 24X41 OB/GYN DISP (PERSONAL CARE ITEMS) ×1 IMPLANT
SAVE CELL AUTOCOAG (MISCELLANEOUS) IMPLANT
SCRUB CHG 4% DYNA-HEX 4OZ (MISCELLANEOUS) ×1 IMPLANT
SPONGE GAUZE 4X4 12PLY (GAUZE/BANDAGES/DRESSINGS) IMPLANT
STAPLER INSORB 30 2030 C-SECTI (MISCELLANEOUS) IMPLANT
SUT MNCRL AB 4-0 PS2 18 (SUTURE) ×1 IMPLANT
SUT PDS AB 1 CT1 27 (SUTURE) IMPLANT
SUT PDS AB 1 TP1 96 (SUTURE) IMPLANT
SUT PLAIN 2 0 XLH (SUTURE) IMPLANT
SUT SILK 4 0 SH (SUTURE) IMPLANT
SUT VIC AB 0 CT1 36 (SUTURE) ×4 IMPLANT
SUT VIC AB 0 CTX 36 (SUTURE) ×4
SUT VIC AB 0 CTX36XBRD ANBCTRL (SUTURE) IMPLANT
SUT VIC AB 3-0 SH 27 (SUTURE) ×1
SUT VIC AB 3-0 SH 27X BRD (SUTURE) ×1 IMPLANT
SUT VICRYL 3-0 27IN SH (SUTURE) IMPLANT
SYSTEM CONTND EXTRCTN KII BLLN (MISCELLANEOUS) IMPLANT
TRAP FLUID SMOKE EVACUATOR (MISCELLANEOUS) ×1 IMPLANT
WATER STERILE IRR 500ML POUR (IV SOLUTION) ×1 IMPLANT

## 2022-12-06 NOTE — Anesthesia Preprocedure Evaluation (Signed)
Anesthesia Evaluation  Patient identified by MRN, date of birth, ID band Patient awake    Reviewed: Allergy & Precautions, NPO status , Patient's Chart, lab work & pertinent test results  History of Anesthesia Complications Negative for: history of anesthetic complications  Airway Mallampati: IV  TM Distance: >3 FB Neck ROM: full    Dental  (+) Dental Advidsory Given, Teeth Intact   Pulmonary neg pulmonary ROS, neg COPD   Pulmonary exam normal        Cardiovascular Exercise Tolerance: Good hypertension, negative cardio ROS Normal cardiovascular exam     Neuro/Psych negative neurological ROS  negative psych ROS   GI/Hepatic negative GI ROS, Neg liver ROS,,,  Endo/Other  negative endocrine ROS    Renal/GU negative Renal ROS  negative genitourinary   Musculoskeletal   Abdominal   Peds  Hematology negative hematology ROS (+) Blood dyscrasia, anemia   Anesthesia Other Findings Past Medical History: No date: Abdominal adhesions     Comment:  dense No date: Gestational hypertension No date: Obesity  Past Surgical History: 2014: CESAREAN SECTION 2015: CESAREAN SECTION 2016: CESAREAN SECTION 2017: CESAREAN SECTION 2019: CESAREAN SECTION  BMI    Body Mass Index: 36.76 kg/m      Reproductive/Obstetrics (+) Pregnancy                             Anesthesia Physical Anesthesia Plan  ASA: 2  Anesthesia Plan: Spinal   Post-op Pain Management:    Induction:   PONV Risk Score and Plan: 3 and TIVA, Ondansetron and Dexamethasone  Airway Management Planned: Natural Airway and Nasal Cannula  Additional Equipment:   Intra-op Plan:   Post-operative Plan:   Informed Consent: I have reviewed the patients History and Physical, chart, labs and discussed the procedure including the risks, benefits and alternatives for the proposed anesthesia with the patient or authorized representative  who has indicated his/her understanding and acceptance.     Dental Advisory Given  Plan Discussed with: Anesthesiologist, CRNA and Surgeon  Anesthesia Plan Comments: (Patient reports no bleeding problems and no anticoagulant use.  Plan for spinal with backup GA  Patient consented for risks of anesthesia including but not limited to:  - adverse reactions to medications - damage to eyes, teeth, lips or other oral mucosa - nerve damage due to positioning  - risk of bleeding, infection and or nerve damage from spinal that could lead to paralysis - risk of headache or failed spinal - damage to teeth, lips or other oral mucosa - sore throat or hoarseness - damage to heart, brain, nerves, lungs, other parts of body or loss of life  Patient voiced understanding.)       Anesthesia Quick Evaluation

## 2022-12-06 NOTE — H&P (Addendum)
Obstetric Preoperative History and Physical  Taylor Velazquez is a 27 y.o. [id number] with IUP at [redacted]w[redacted]d presenting for presenting for scheduled repeat cesarean section. Patient with history of prior C-section x 5 (including 1 classical).  Pre-operative concerns include mild anemia of pregnancy, gestational thrombocytopenia, abnormal 1 hour glucola (did not complete 3 hour), history of PPH.   Prenatal Course Source of Care: [facility]  with onset of care at 34 weeks, previously transferred care from [place] (limited [facility] in [place] at [place] for [facility]).  Total PNC visits: 5 Pregnancy complications or risks:  Patient Active Problem List   Diagnosis Date Noted   Benign gestational thrombocytopenia in third trimester (HCC) 11/10/2022   Anemia of pregnancy in third trimester 11/10/2022   Late prenatal care affecting pregnancy 10/25/2022   Red Chart Rounds Patient 09/29/2022   History of postpartum hemorrhage 09/29/2022   Alpha thalassemia silent carrier 08/02/2022   History of gestational hypertension 07/15/2022   Supervision of high risk pregnancy, antepartum 06/28/2022   Previous cesarean delivery affecting pregnancy, antepartum 10/09/2013   She plans to breastfeed and formula feed She desires  Nexplanon  for postpartum contraception.   Prenatal labs and studies: ABO, Rh: --/--/O POS (01/09 0602) Antibody: NEG (01/09 0602) Rubella: 1.38 (08/18 1104) RPR: Non Reactive (11/27 1059)  HBsAg: Negative (08/18 1104)  HIV: NON REACTIVE (01/08 1337)  MNO:TRRNHAFB/-- (01/09 0601) 1 hr Glucola  abnormal, 151, did not complete 3 hour Genetic screening normal Anatomy US normal    Past Medical History:  Diagnosis Date   Abdominal adhesions    dense   Gestational hypertension    Obesity     Past Surgical History:  Procedure Laterality Date   CESAREAN SECTION  2014   CESAREAN SECTION  2015   CESAREAN SECTION  2016   CESAREAN SECTION  2017   CESAREAN SECTION  2019     OB History  Gravida Para Term Preterm AB Living  6 5 5    0 6  SAB IAB Ectopic Multiple Live Births  0 0 0 1 6    # Outcome Date GA Lbr Len/2nd Weight Sex Delivery Anes PTL Lv  6 Current           5 Term 08/22/18    M CS-LTranv   LIV  4 Term 03/30/16    M CS-LTranv   LIV  3 Term 01/01/15    M CS-LTranv   LIV  2A Term 01/03/14 [redacted]w[redacted]d   M CS-LTranv  N LIV  2B Term 01/03/14 [redacted]w[redacted]d   F CS-Unspec   LIV  1 Term 01/08/13    03/08/13   LIV    Social History   Socioeconomic History   Marital status: Significant Other    Spouse name: Not on file   Number of children: 5   Years of education: Not on file   Highest education level: Not on file  Occupational History   Not on file  Tobacco Use   Smoking status: Never   Smokeless tobacco: Never  Vaping Use   Vaping Use: Never used  Substance and Sexual Activity   Alcohol use: Never   Drug use: Never   Sexual activity: Yes    Birth control/protection: None    Comment: Pt reports wanting IUD after this pregnancy  Other Topics Concern   Not on file  Social History Narrative   Lives with children   Social Determinants of Health   Financial Resource Strain: Not on file  Food  Insecurity: No Food Insecurity (12/06/2022)   Hunger Vital Sign    Worried About Running Out of Food in the Last Year: Never true    Ran Out of Food in the Last Year: Never true  Transportation Needs: No Transportation Needs (12/06/2022)   PRAPARE - Administrator, Civil Service (Medical): No    Lack of Transportation (Non-Medical): No  Physical Activity: Not on file  Stress: Not on file  Social Connections: Not on file    Family History  Problem Relation Age of Onset   Hypertension Father    Asthma Father    Cancer Neg Hx    Diabetes Neg Hx    Heart disease Neg Hx    Stroke Neg Hx     Medications Prior to Admission  Medication Sig Dispense Refill Last Dose   aspirin 81 MG chewable tablet Chew 1 tablet (81 mg total) by mouth daily. 30  tablet 8 Past Week   diphenhydramine-acetaminophen (TYLENOL PM) 25-500 MG TABS tablet Take 1 tablet by mouth at bedtime as needed.   Past Month   prenatal vitamin w/FE, FA (PRENATAL 1 + 1) 27-1 MG TABS tablet Take 1 tablet by mouth daily at 12 noon. 30 tablet 11 Past Week   Blood Pressure Monitoring DEVI 1 each by Does not apply route once a week. 1 each 0    EPINEPHrine 0.3 mg/0.3 mL IJ SOAJ injection Gotten for hair dye reaction (Patient not taking: Reported on 12/06/2022)   Not Taking    No Known Allergies  Review of Systems: Negative except for what is mentioned in HPI.  Physical Exam: BP 109/63   Pulse 85   Temp 98.2 F (36.8 C) (Oral)   Resp 16   Ht 5\' 9"  (1.753 m)   Wt 112.9 kg   LMP 04/02/2022   BMI 36.76 kg/m  FHR by Doppler: 125 bpm GENERAL: Well-developed, well-nourished female in no acute distress.  LUNGS: Clear to auscultation bilaterally.  HEART: Regular rate and rhythm. ABDOMEN: Soft, nontender, nondistended, gravid, well-healed Pfannenstiel incision and supraumbilical incision. PELVIC: Deferred EXTREMITIES: Nontender, no edema, 2+ distal pulses.   Pertinent Labs/Studies:   Results for orders placed or performed during the hospital encounter of 12/06/22 (from the past 72 hour(s))  Group B strep by PCR     Status: None   Collection Time: 12/06/22  6:01 AM   Specimen: Vaginal/Rectal; Genital  Result Value Ref Range   Group B strep by PCR NEGATIVE NEGATIVE    Comment: (NOTE) Intrapartum testing with Xpert GBS assay should be used as an adjunct to other methods available and not used to replace antepartum testing (at 35-[redacted] weeks gestation). Performed at [facility], [address] [place], [place]   Glucose, capillary     Status: None   Collection Time: 12/06/22  6:01 AM  Result Value Ref Range   Glucose-Capillary 93 70 - 99 mg/dL    Comment: Glucose reference range applies only to samples taken after fasting for at least 8 hours.  CBC      Status: Abnormal   Collection Time: 12/06/22  6:02 AM  Result Value Ref Range   WBC 9.4 4.0 - 10.5 K/uL   RBC 4.03 3.87 - 5.11 MIL/uL   Hemoglobin 10.6 (L) 12.0 - 15.0 g/dL   HCT 02/04/23 (L) 60.4 - 54.0 %   MCV 83.4 80.0 - 100.0 fL   MCH 26.3 26.0 - 34.0 pg   MCHC 31.5 30.0 - 36.0  g/dL   RDW 14.8 11.5 - 15.5 %   Platelets 109 (L) 150 - 400 K/uL    Comment: REPEATED TO VERIFY   nRBC 0.0 0.0 - 0.2 %    Comment: Performed at [facility], [address] [place], [place] [address]  Type and screen [place]     Status: None   Collection Time: 12/06/22  6:02 AM  Result Value Ref Range   ABO/RH(D) O POS    Antibody Screen NEG    Sample Expiration      12/09/2022,2359 Performed at [facility], [address] [place], [place] [address]    Hospital Outpatient Visit on 12/05/2022  Component Date Value Ref Range Status   WBC 12/05/2022 8.3  4.0 - 10.5 K/uL Final   RBC 12/05/2022 3.98  3.87 - 5.11 MIL/uL Final   Hemoglobin 12/05/2022 10.5 (L)  12.0 - 15.0 g/dL Final   HCT 12/05/2022 33.2 (L)  36.0 - 46.0 % Final   MCV 12/05/2022 83.4  80.0 - 100.0 fL Final   MCH 12/05/2022 26.4  26.0 - 34.0 pg Final   MCHC 12/05/2022 31.6  30.0 - 36.0 g/dL Final   RDW 12/05/2022 14.6  11.5 - 15.5 % Final   Platelets 12/05/2022 109 (L)  150 - 400 K/uL Final   nRBC 12/05/2022 0.0  0.0 - 0.2 % Final   Performed at [facility], [address] [place], [place] [address]   ABO/RH(D) 12/05/2022 O POS   Final   Antibody Screen 12/05/2022 NEG   Final   Sample Expiration 12/05/2022 12/08/2022,2359   Final   Extend sample reason 12/05/2022    Final                   Value:PREGNANT WITHIN 3 MONTHS, UNABLE TO EXTEND Performed at [facility], [address] [place], [place] [address]    HIV-1 P24 Antigen - HIV24 12/05/2022 NON REACTIVE  NON REACTIVE Final   Comment: (NOTE) Detection of p24 may be inhibited by biotin in the sample,  causing false negative results in acute infection.    HIV 1/2 Antibodies 12/05/2022 NON REACTIVE  NON REACTIVE Final   Interpretation (HIV Ag Ab) 12/05/2022 A non reactive test result means that HIV 1 or HIV 2 antibodies and HIV 1 p24 antigen were not detected in the specimen.   Final   Performed at [facility], [address] [place], [place] [address]    Assessment and Plan :Taylor Velazquez is a 27 y.o. G6P5006 at [redacted]w[redacted]d being admitted  for scheduled repeat cesarean section delivery. Pregnancy complicated by limited PNC, history of C-section x 5, gestational thrombocytopenia, anemia.   Consent: The patient is understanding of the planned procedure and is aware of and accepting of all surgical risks, including but not limited to: bleeding which may require transfusion or reoperation; infection which may require antibiotics; injury to bowel, bladder, ureters or other surrounding organs which may require repair; injury to the fetus; need for additional procedures including hysterectomy in the event of life-threatening complications; placental abnormalities wth subsequent pregnancies; incisional problems; blood clot disorders which may require blood thinners;, and other postoperative/anesthesia complications. The patient is in agreement with the proposed plan, and gives informed written consent for the procedure. All questions have been answered.  Patient has been discussed with MFM (Dr. Johnell Comings) prior to scheduling of her C-section, who recommended proceeding with delivery at 36 weeks as patient with multiple previous C-sections  including 1 prior classical C-section (with significant adhesive disease) due to risk of need for emergent delivery from onset of labor.    Hildred Laser, MD [facility] at [facility]

## 2022-12-06 NOTE — Anesthesia Postprocedure Evaluation (Signed)
Anesthesia Post Note  Patient: Taylor Velazquez  Procedure(s) Performed: [place]  Patient location during evaluation: Mother Baby Anesthesia Type: Spinal Level of consciousness: oriented and awake and alert Pain management: pain level controlled Vital Signs Assessment: post-procedure vital signs reviewed and stable Respiratory status: spontaneous breathing and respiratory function stable Cardiovascular status: blood pressure returned to baseline and stable Postop Assessment: no headache, no backache, no apparent nausea or vomiting and able to ambulate Anesthetic complications: no  No notable events documented.   Last Vitals:  Vitals:   12/06/22 0627 12/06/22 0630  BP: 109/63   Pulse: 85   Resp: 16   Temp:  36.8 C    Last Pain:  Vitals:   12/06/22 0630  TempSrc: Oral  PainSc:                  Dimas Millin

## 2022-12-06 NOTE — Transfer of Care (Signed)
Immediate Anesthesia Transfer of Care Note  Patient: Taylor Velazquez  Procedure(s) Performed: REPEAT CESAREAN SECTION  Patient Location: Mother/Baby  Anesthesia Type:Spinal  Level of Consciousness: awake, alert , and oriented  Airway & Oxygen Therapy: Patient Spontanous Breathing  Post-op Assessment: Report given to RN and Post -op Vital signs reviewed and stable  Post vital signs: Reviewed and stable  Last Vitals:  Vitals Value Taken Time  BP 117/83   Temp    Pulse 81   Resp 12   SpO2 99     Last Pain:  Vitals:   12/06/22 0630  TempSrc: Oral  PainSc:          Complications: No notable events documented.

## 2022-12-06 NOTE — Anesthesia Procedure Notes (Signed)
Spinal  Patient location during procedure: OR Start time: 12/06/2022 8:00 AM End time: 12/06/2022 8:03 AM Reason for block: surgical anesthesia Staffing Performed: resident/CRNA  Anesthesiologist: Dimas Millin, MD Resident/CRNA: Norm Salt, CRNA Performed by: Lily Peer, Charyl Minervini, CRNA Authorized by: Dimas Millin, MD   Preanesthetic Checklist Completed: patient identified, IV checked, site marked, risks and benefits discussed, surgical consent, monitors and equipment checked and pre-op evaluation Spinal Block Patient position: sitting Prep: ChloraPrep Patient monitoring: heart rate, continuous pulse ox and blood pressure Approach: midline Location: L3-4 Injection technique: single-shot Needle Needle type: Sprotte  Needle gauge: 24 G Needle length: 10 cm Assessment Events: CSF return Additional Notes IV functioning, monitors applied to pt. Expiration date of kit checked and confirmed to be in date. Sterile prep and drape, hand hygiene and sterile gloved used. Pt was positioned and spine was prepped in sterile fashion. Skin was anesthetized with lidocaine. Free flow of clear CSF obtained prior to injecting local anesthetic into CSF x 1 attempt. Spinal needle aspirated freely following injection. Needle was carefully withdrawn, and pt tolerated procedure well. Loss of motor and sensory on exam post injection.

## 2022-12-06 NOTE — Op Note (Signed)
Cesarean Section Procedure Note  Indications: previous Cesarean section x 5, with prior classical Cesarean delivery  Pre-operative Diagnosis: 36 week 4 day pregnancy, history of prior Cesarean Section x 5 (including last pregnancy with classical C-section), history of pelvic adhesions, anemia of pregnancy, gestational thrombocytopenia, limited prenatal care, abnormal 1 hour glucola without completion of 3 hour testing.  Post-operative Diagnosis: Same  Procedure: Repeat Cesarean section (high transverse) with Mirena IUD insertion, enterotomy with repair, Cell Saver's present  Surgeon: Rubie Maid, MD  Assistants:  Jeannie Fend, MD  Anesthesia: Spinal anesthesia  Findings: Female infant, cephalic presentation, 8295 grams, with Apgar scores of 8 at one minute and 9 at five minutes. Intact placenta with 3 vessel cord.  Clear amniotic fluid at amniotomy Dense adhesions of bladder to lower uterine segment.  Filmy adhesions of the small bowel to the anterior uterine surface creating serosal covering for 4 cm vertical defect of the uterus with depth into the the endometrial cavity.   Adhesions of the small bowel to anterior abdominal wall.  Small enterotomy  ~ 1 cm identified in small bowel near adhesion of anterior abdominal wall.  Ovaries and fallopian tubes appeared normal.   Procedure Details: The patient was seen in the Holding Room. The risks, benefits, complications, treatment options, and expected outcomes were discussed with the patient.  The patient concurred with the proposed plan, giving informed consent.  The site of surgery properly noted/marked. The patient was taken to the Operating Room, identified as Taylor Velazquez and the procedure verified as C-Section Delivery. A Time Out was held and the above information confirmed.  After induction of anesthesia, the patient was draped and prepped in the usual sterile manner. Anesthesia was tested and noted to be adequate. A supraumbilical  incision was made and carried down through the subcutaneous tissue to the fascia. Thick scar tissue was encountered. Fascial incision was made and extended transversely. No underlying rectus tissue or peritoneum was identified due to the dense scar tissue.   The uterus was encountered. The surgical assist was able to provide retraction to allow for clear visualization of surgical site. An Alexis retractor was placed in the abdomen for additional retraction. The pick scarring and dense adhesions were noted to the lower uterine segments of the bladder was not able to be identified.  Bowel adhesions were noted on the upper one third of the anterior surface of the uterus.  Due to the significant adhesive disease the decision was made to perform a high transverse uterine incision. Delivered from cephalic presentation was a 3320 gram Female with Apgar scores of 8 at one minute and 9 at five minutes.  The assistant was able to apply adequate fundal pressure to allow for successful delivery of the fetus. After the umbilical cord was clamped and cut, cord blood was obtained for evaluation. Delayed cord clamping was observed. The placenta was removed intact and appeared normal.   The uterus was exteriorized and cleared of all clots and debris. Further assessment of the small bowel adhesions to the anterior surface of the uterus was performed.  The adhesions were lysed using sharp dissection with Metzenbaum scissors.  After removal of the adhesions it was realized that what was thought to be a uterine window was actually a through-and-through uterine defect of approximately 4 cm in length that was previously covered by the small bowel serosa.  The defect was closed in 2 layers using running sutures of 0 Vicryl.  Tubes and ovaries appeared normal.  After delivery of  the fetus patient notes that she now requests insertion of immediate postpartum IUD.  The IUD was removed from the packaging and placed at the fundus of the uterus.   The strings were then directed into the lower uterine segment towards the cervix using a ring forceps.  The uterine incision was closed with a running locked suture of 0-Vicryl.  A second suture of 0-Vicryl was used in an imbricating layer.  Oozing was still noted from the incision site so a third suture of 0 Vicryl was used to place several figure-of-eight stitches along the incision line until hemostasis was observed. The uterus was then returned to the abdomen. The pericolic gutters were cleared of all clots and debris. Interceed was placed along the top of the uterus covering the uterine defect.  Arista was placed on both sites of repair for added hemostasis.  The fascia was then grasped with Zannie Cove and Tresa Endo clamps, and injected with a total of 60 ml of 1.3% Exparel solution (20 ml of bupivicaine liposomal mixed with 30 ml of 0.5% Marcaine and diluted in 50 ml of normal saline).  With removal of the left-sided Kocher clamp it was noted that the clamp had been placed on an area of fascia which had adhesions to the small bowel.  On inspection of the bowel there appeared to be a small approximately 1 cm defect of the small bowel with enterotomy noted.  The enterotomy site was reapproximated with a running suture of 3-0 Vicryl.  An imbricating layer of the serosa was performed using a 4-0 silk suture.  No fecal matter was noted to be expelled from the site.   The fascia was then reapproximated with a running suture of looped Vicryl PDS. The subcutaneous fat layer was reapproximated with 2-0 Vicryl in 2 layers. The skin was reapproximated with absorbable staples. The skin and subcutaneous tissues were then injected with an additional 40 ml of the Exparel solution. The incision was covered with steri-strips and a pressure dressing.  Patient was able to receive 1 unit of Cell Saver's at the end of her procedure.  Instrument, sponge, and needle counts were correct prior the abdominal closure and at the conclusion  of the case.    An experienced assistant was required given the standard of surgical care given the complexity of the case.  This assistant was needed for exposure, dissection, suctioning, retraction, instrument exchange, and for overall help during the procedure.  Estimated Blood Loss:  892 ml      Drains: foley catheter to gravity drainage, 250 ml of clear urine at end of the procedure         Total IV Fluids: 800 ml  Specimens: None         Implants: None         Complications:  Enterotomy, small. Repaired intraoperatively.          Disposition: PACU - hemodynamically stable.         Condition: stable   Hildred Laser, MD [facility] at [facility]

## 2022-12-07 DIAGNOSIS — O99810 Abnormal glucose complicating pregnancy: Secondary | ICD-10-CM | POA: Diagnosis present

## 2022-12-07 LAB — CBC
HCT: 25 % — ABNORMAL LOW (ref 36.0–46.0)
Hemoglobin: 7.9 g/dL — ABNORMAL LOW (ref 12.0–15.0)
MCH: 26.6 pg (ref 26.0–34.0)
MCHC: 31.6 g/dL (ref 30.0–36.0)
MCV: 84.2 fL (ref 80.0–100.0)
Platelets: 88 10*3/uL — ABNORMAL LOW (ref 150–400)
RBC: 2.97 MIL/uL — ABNORMAL LOW (ref 3.87–5.11)
RDW: 14.7 % (ref 11.5–15.5)
WBC: 10.5 10*3/uL (ref 4.0–10.5)
nRBC: 0 % (ref 0.0–0.2)

## 2022-12-07 LAB — STREP GP B NAA: Strep Gp B NAA: NEGATIVE

## 2022-12-07 MED ORDER — SIMETHICONE 80 MG PO CHEW
80.0000 mg | CHEWABLE_TABLET | Freq: Four times a day (QID) | ORAL | Status: DC
  Administered 2022-12-07 – 2022-12-08 (×3): 80 mg via ORAL
  Filled 2022-12-07 (×3): qty 1

## 2022-12-07 MED ORDER — KETOROLAC TROMETHAMINE 30 MG/ML IJ SOLN
30.0000 mg | Freq: Four times a day (QID) | INTRAMUSCULAR | Status: AC | PRN
  Filled 2022-12-07: qty 1

## 2022-12-07 MED ORDER — ACETAMINOPHEN 500 MG PO TABS: 1000.0000 mg | ORAL_TABLET | Freq: Four times a day (QID) | ORAL | Status: DC

## 2022-12-07 MED ORDER — ACETAMINOPHEN 500 MG PO TABS
1000.0000 mg | ORAL_TABLET | Freq: Four times a day (QID) | ORAL | Status: DC
  Administered 2022-12-07 – 2022-12-08 (×5): 1000 mg via ORAL
  Filled 2022-12-07 (×5): qty 2

## 2022-12-07 MED ORDER — KETOROLAC TROMETHAMINE 30 MG/ML IJ SOLN
30.0000 mg | Freq: Four times a day (QID) | INTRAMUSCULAR | Status: AC | PRN
  Administered 2022-12-07: 30 mg via INTRAVENOUS

## 2022-12-07 NOTE — Progress Notes (Signed)
Postpartum Day # 1: Cesarean Delivery (repeat x 5) with Mirena IUD insertion  Subjective: Patient reports tolerating PO and no problems voiding.    Objective: Vital signs in last 24 hours: Temp:  [98 F (36.7 C)-98.9 F (37.2 C)] 98.3 F (36.8 C) (01/10 0759) Pulse Rate:  [83-105] 92 (01/10 0759) Resp:  [16-27] 20 (01/10 0759) BP: (100-126)/(55-97) 100/55 (01/10 0759) SpO2:  [94 %-100 %] 100 % (01/10 0759)  Physical Exam:  General: alert and no distress Lungs: clear to auscultation bilaterally Breasts: normal appearance, no masses or tenderness Heart: regular rate and rhythm, S1, S2 normal, no murmur, click, rub or gallop Abdomen: soft, non-tender; bowel sounds normal; no masses,  no organomegaly. Mild distention present.  Pelvis: Lochia appropriate, Uterine Fundus firm, Incision: no dehiscence, no significant erythema. Small amount of serosanguinous drainage noted from base of incision on expression.  Extremities: DVT Evaluation: No evidence of DVT seen on physical exam. Negative Homan's sign. No cords or calf tenderness. No significant calf/ankle edema.    Recent Labs    12/06/22 0602 12/07/22 0555  HGB 10.6* 7.9*  HCT 33.6* 25.0*    Assessment/Plan: Status post Cesarean section. Doing well postoperatively.  Circumcision prior to discharge Contraception IUD placed intraoperatively Advance to regular diet as tolerated.  Continue PO pain management Encourge ambulation Will reinforce bandage and place abdominal binder.  Continue current care.  Rubie Maid, MD [facility]

## 2022-12-07 NOTE — Progress Notes (Signed)
Pt called out around 0025 and said "I was walking around the room and felt something running down my leg, it's blood"; RN and tech had pt go to toilet and did peri care and pad change; RN had pt go back to bed to assess dressing and incision; bottom of vertical incision dressing has bright red blood on it; outer part of dressing is wet with serous drainage; RN removed tape; steri strips, telfa, gauze saturated and part of tape saturated; RN notified annie thompson, CNM; CNM said to RN to apply new dressing; 2 RNs in room at 0035 to apply new dressing; RN noticed about 1 inch of bottom of incision has serous drainage; RN applied pressure; that lower part of incision does ooze when RN assessing incision; new steri strips, telfa, 4x4 gauze and ABD pads applied with tape (pressure dressing); will continue to monitor dressing

## 2022-12-08 DIAGNOSIS — D696 Thrombocytopenia, unspecified: Secondary | ICD-10-CM

## 2022-12-08 DIAGNOSIS — O9902 Anemia complicating childbirth: Secondary | ICD-10-CM

## 2022-12-08 DIAGNOSIS — O34211 Maternal care for low transverse scar from previous cesarean delivery: Secondary | ICD-10-CM

## 2022-12-08 DIAGNOSIS — D649 Anemia, unspecified: Secondary | ICD-10-CM

## 2022-12-08 DIAGNOSIS — O99113 Other diseases of the blood and blood-forming organs and certain disorders involving the immune mechanism complicating pregnancy, third trimester: Secondary | ICD-10-CM

## 2022-12-08 DIAGNOSIS — O0933 Supervision of pregnancy with insufficient antenatal care, third trimester: Secondary | ICD-10-CM

## 2022-12-08 DIAGNOSIS — Z3A36 36 weeks gestation of pregnancy: Secondary | ICD-10-CM

## 2022-12-08 LAB — TYPE AND SCREEN
ABO/RH(D): O POS
Antibody Screen: NEGATIVE
Extend sample reason: UNDETERMINED

## 2022-12-08 MED ORDER — IBUPROFEN 600 MG PO TABS: 600.0000 mg | ORAL_TABLET | Freq: Four times a day (QID) | ORAL | 0 refills | Status: DC

## 2022-12-08 MED ORDER — SLOW FE 142 (45 FE) MG PO TBCR: 1.0000 | EXTENDED_RELEASE_TABLET | Freq: Every day | ORAL | 1 refills | Status: DC

## 2022-12-08 MED ORDER — FERROUS SULFATE 325 (65 FE) MG PO TABS: 325.0000 mg | ORAL_TABLET | ORAL | 1 refills | Status: DC

## 2022-12-08 MED ORDER — POLYETHYLENE GLYCOL 3350 17 G PO PACK: 17.0000 g | PACK | Freq: Every day | ORAL | Status: DC

## 2022-12-08 MED ORDER — POLYETHYLENE GLYCOL 3350 17 G PO PACK
17.0000 g | PACK | Freq: Every day | ORAL | Status: DC
  Administered 2022-12-08: 17 g via ORAL
  Filled 2022-12-08: qty 1

## 2022-12-08 MED ORDER — OXYCODONE-ACETAMINOPHEN 5-325 MG PO TABS: 2.0000 | ORAL_TABLET | ORAL | 0 refills | Status: DC | PRN

## 2022-12-08 NOTE — Discharge Instructions (Signed)

## 2022-12-08 NOTE — Progress Notes (Signed)
Patient discharged home with family.  Discharge instructions, when to follow up, and prescriptions reviewed with patient.  Patient verbalized understanding. Patient will be escorted out by auxiliary.   

## 2022-12-08 NOTE — Discharge Summary (Addendum)
Postpartum Discharge Summary  Date of Service updated 12/08/2022     Patient Name: Taylor Velazquez DOB: [birth year] MRN: [id number]  Date of admission: 12/06/2022 Delivery date:12/06/2022  Delivering provider: Rubie Maid  Date of discharge: 12/08/2022  Admitting diagnosis: History of cesarean delivery affecting pregnancy [O34.219] Intrauterine pregnancy: [redacted]w[redacted]d     Secondary diagnosis:  Principal Problem:   History of 5 cesarean sections Active Problems:   Limited prenatal care, antepartum   Abnormal O'Sullivan glucose challenge test, antepartum   Postpartum care following cesarean delivery  Additional problems: Repeat CS and removal of adhesions. Placement of IUD    Discharge diagnosis: Term Pregnancy Delivered                                              Post partum procedures: none Augmentation: N/A Complications: None  Hospital course: Sceduled C/S   27 y.o. yo [id number] at [redacted]w[redacted]d was admitted to the hospital 12/06/2022 for scheduled cesarean section with the following indication:Elective Repeat.Delivery details are as follows:  Membrane Rupture Time/Date: 8:35 AM ,12/06/2022   Delivery Method:C-Section, Classical  Details of operation can be found in separate operative note.  Patient had a postpartum course complicated by some incisional drainage on her first PP day.  She is ambulating, tolerating a regular diet, passing flatus, and urinating well. Patient is discharged home in stable condition on  12/08/22        Newborn Data: Birth date:12/06/2022  Birth time:8:36 AM  Gender:Female  Living status:Living  Apgars:8 ,9  Weight:3320 g     Magnesium Sulfate received: No BMZ received: No Rhophylac:N/A MMR:No T-DaP:Given prenatally Flu: No Transfusion:No  Physical exam  Vitals:   12/07/22 0759 12/07/22 1548 12/07/22 2315 12/08/22 0839  BP: (!) 100/55 (!) 95/52 126/88 128/75  Pulse: 92  (!) 107 (!) 112  Resp: 20 20 18 20   Temp: 98.3 F (36.8 C) 98.8 F (37.1 C) 98.4 F  (36.9 C) (!) 97.5 F (36.4 C)  TempSrc: Oral Oral Oral Oral  SpO2: 100%  100% 100%  Weight:      Height:       General: alert, cooperative, and no distress Lochia: appropriate Uterine Fundus: firm Incision: Dressing is clean, dry, and intact DVT Evaluation: No evidence of DVT seen on physical exam. Negative Homan's sign. No cords or calf tenderness. Labs: Lab Results  Component Value Date   WBC 10.5 12/07/2022   HGB 7.9 (L) 12/07/2022   HCT 25.0 (L) 12/07/2022   MCV 84.2 12/07/2022   PLT 88 (L) 12/07/2022      Latest Ref Rng & Units 07/15/2022   11:04 AM  CMP  Glucose 70 - 99 mg/dL 90   BUN 6 - 20 mg/dL 6   Creatinine 0.57 - 1.00 mg/dL 0.54   Sodium 134 - 144 mmol/L 140   Potassium 3.5 - 5.2 mmol/L 3.9   Chloride 96 - 106 mmol/L 106   CO2 20 - 29 mmol/L 18   Calcium 8.7 - 10.2 mg/dL 9.0   Total Protein 6.0 - 8.5 g/dL 6.3   Total Bilirubin 0.0 - 1.2 mg/dL 0.3   Alkaline Phos 44 - 121 IU/L 48   AST 0 - 40 IU/L 26   ALT 0 - 32 IU/L 28    Edinburgh Score:    12/07/2022    8:10 PM  Edinburgh Postnatal Depression Scale  Screening Tool  I have been able to laugh and see the funny side of things. 0  I have looked forward with enjoyment to things. 0  I have blamed myself unnecessarily when things went wrong. 0  I have been anxious or worried for no good reason. 0  I have felt scared or panicky for no good reason. 0  Things have been getting on top of me. 0  I have been so unhappy that I have had difficulty sleeping. 0  I have felt sad or miserable. 0  I have been so unhappy that I have been crying. 0  The thought of harming myself has occurred to me. 0  Edinburgh Postnatal Depression Scale Total 0      After visit meds:      Discharge home in stable condition Infant Feeding: Bottle Infant Disposition:home with mother Discharge instruction: per After Visit Summary and Postpartum booklet. Activity: Advance as tolerated. Pelvic rest for 6 weeks.  Diet: routine  diet Anticipated Birth Control: PP IUD placed Postpartum Appointment:1 week Additional Postpartum F/U: Incision check 1 week Future Appointments: Future Appointments  Date Time Provider [place]  12/14/2022  9:55 AM Rubie Maid, MD [facility] None   Follow up Visit:  Follow-up Information     Rubie Maid, MD Follow up in 1 week(s).   Specialties: Obstetrics and Gynecology, Radiology Why: Please see Dr. Marcelline Mates in the office next week for an incision check. Please also make an appointment for a 6 week post delivery physical. Contact information: [address] [place] [address] [phone]                     12/08/2022 Imagene Riches, CNM

## 2022-12-09 ENCOUNTER — Telehealth: Payer: Self-pay

## 2022-12-09 LAB — TYPE AND SCREEN
ABO/RH(D): O POS
Antibody Screen: NEGATIVE

## 2022-12-09 NOTE — Telephone Encounter (Signed)
Taylor Velazquez called triage line stating she had her son on 12/06/2022. She stated that both legs are swollen and she can't walk, she also stated she had to cancel her son doctor appointments cause she can't walk.  She asked if we could call in something for trhe swelling.

## 2022-12-12 ENCOUNTER — Telehealth: Payer: Self-pay

## 2022-12-12 NOTE — Telephone Encounter (Signed)
Called [name] as I received the fax from the after hour nurse line. Taylor Velazquez was calling to see if she can change the bandage, she had a c-section on 01/09.  Called and left voicemail.

## 2022-12-13 NOTE — Progress Notes (Signed)
    OBSTETRICS/GYNECOLOGY POST-OPERATIVE CLINIC VISIT  Subjective:     Taylor Velazquez is a 27 y.o. female who presents to the clinic 1 weeks status post REPEAT CESAREAN SECTION with immediate PP IUD insertion for History of previous C-sections x 5 at 36 weeks . Eating a regular diet without difficulty. Bowel movements are normal. Pain is not well controlled.  Medications being used: prescription NSAID's including ibuprofen (Motrin). Currently notes pain is 7/10 at times. She has some tenderness at the base of her incision. Notes bleeding has been light to moderate. Formula feeding.  Baby is doing well.   The following portions of the patient's history were reviewed and updated as appropriate: allergies, current medications, past family history, past medical history, past social history, past surgical history, and problem list.  Review of Systems Pertinent items noted in HPI and remainder of comprehensive ROS otherwise negative.   Objective:   BP (!) 132/98   Pulse 80   Wt 227 lb 12.8 oz (103.3 kg)   BMI 33.64 kg/m  Body mass index is 33.64 kg/m.  General:  alert and no distress  Abdomen: soft, bowel sounds active, non-tender  Incision:   healing well, no drainage, no erythema, no hernia, no seroma, no swelling, no dehiscence, incision overall well approximated with few small areas of superficial skin separation.       Pathology:   None  Assessment:   Patient s/p Repeat C-section and immediate PP IUD placement Doing well postoperatively.   Plan:   1. Continue any current medications as instructed by provider. Small refill given on Percocet.  2. Wound care discussed.  Few small superficial staples removed and steri-strips placed.  3. Operative findings again reviewed. Pathology report discussed. 4. Activity restrictions: no bending, stooping, or squatting, no lifting more than 10-15 pounds, and pelvic rest 5. Anticipated return to work: 4 weeks if applicable. 6. Follow up: 4  weeks for postpartum visit and IUD string check.    Rubie Maid, MD [place]

## 2022-12-14 ENCOUNTER — Telehealth: Payer: Self-pay | Admitting: Obstetrics and Gynecology

## 2022-12-14 ENCOUNTER — Ambulatory Visit (INDEPENDENT_AMBULATORY_CARE_PROVIDER_SITE_OTHER): Payer: Medicaid Other | Admitting: Obstetrics and Gynecology

## 2022-12-14 ENCOUNTER — Encounter: Payer: Self-pay | Admitting: Obstetrics and Gynecology

## 2022-12-14 VITALS — BP 132/98 | HR 80 | Wt 227.8 lb

## 2022-12-14 DIAGNOSIS — Z4889 Encounter for other specified surgical aftercare: Secondary | ICD-10-CM

## 2022-12-14 MED ORDER — OXYCODONE-ACETAMINOPHEN 5-325 MG PO TABS: 1.0000 | ORAL_TABLET | Freq: Four times a day (QID) | ORAL | 0 refills | Status: DC | PRN

## 2022-12-14 NOTE — Telephone Encounter (Signed)
Patient states she was seen today by Dr. Marcelline Mates and forgot to tell her about her back pain she is having. Patient states it is "Sharp feeling."

## 2022-12-15 NOTE — Telephone Encounter (Signed)
She can try heating pads to the area as needed. May have been secondary to her anesthesia (spinal) during her C-section). Usually this pain is temporary and will go away in a week or 2.  She also was given a refill on her pain medication yesterday, so this may help as well.

## 2022-12-15 NOTE — Telephone Encounter (Signed)
Sent patient mychart message about back pain.

## 2022-12-22 NOTE — Telephone Encounter (Signed)
Contacted patient to follow up in regards to my chart message.Patient describes flow as very heavy and states that blood has been dark in color. Patient denies passing any blood clots, patient states that she is changing one pad every 45-54min. Patient states that her skin is raised under her incision and is painful when exerting her self or lifting. Patient states that she has been experiencing sharp shooting pain in her vaginal area and back. Patient denies symptoms of fever,  redness around site of incision or skin feeling hot to the touch at site of incision.

## 2022-12-23 ENCOUNTER — Ambulatory Visit (INDEPENDENT_AMBULATORY_CARE_PROVIDER_SITE_OTHER): Payer: Medicaid Other | Admitting: Obstetrics and Gynecology

## 2022-12-23 ENCOUNTER — Encounter: Payer: Self-pay | Admitting: Obstetrics and Gynecology

## 2022-12-23 VITALS — BP 134/93 | HR 78 | Resp 16 | Ht 69.0 in | Wt 227.0 lb

## 2022-12-23 DIAGNOSIS — O9089 Other complications of the puerperium, not elsewhere classified: Secondary | ICD-10-CM

## 2022-12-23 DIAGNOSIS — Z09 Encounter for follow-up examination after completed treatment for conditions other than malignant neoplasm: Secondary | ICD-10-CM

## 2022-12-23 DIAGNOSIS — R102 Pelvic and perineal pain: Secondary | ICD-10-CM

## 2022-12-23 MED ORDER — METHYLERGONOVINE MALEATE 0.2 MG PO TABS: 0.2000 mg | ORAL_TABLET | Freq: Three times a day (TID) | ORAL | 0 refills | Status: AC

## 2022-12-23 NOTE — Progress Notes (Signed)
    OBSTETRICS/GYNECOLOGY POST-OPERATIVE CLINIC VISIT  Subjective:     Taylor Velazquez is a 27 y.o. female who presents to the clinic 2 weeks status post REPEAT CESAREAN SECTION  for history of previous C-sections x 5 at 36 weeks. Today she has concerns about heavy bleeding. She reports that she is having to change her pad every hour. Denies any dizziness, weakness, or shortness of breath. Currently has IUD in place. She also complains that her sutures are poking her abdomen causing her pain. Lastly noting some issues with back pain, states pain is sharp shooting and sometimes goes down to her leg.  No inciting events, nothing makes pain better or worse.   The following portions of the patient's history were reviewed and updated as appropriate: allergies, current medications, past family history, past medical history, past social history, past surgical history, and problem list.  Review of Systems Pertinent items noted in HPI and remainder of comprehensive ROS otherwise negative.   Objective:   BP (!) 134/93   Pulse 78   Resp 16   Ht 5\' 9"  (1.753 m)   Wt 227 lb (103 kg)   BMI 33.52 kg/m  Body mass index is 33.52 kg/m.  General:  alert and no distress  Abdomen: soft, bowel sounds active, non-tender  Incision:   healing well, no drainage, no erythema, no hernia, no seroma, no swelling, no dehiscence, incision well approximated. Several staples and stitch material protruding through incision  Pelvis:   external genitalia normal, rectovaginal septum normal.  Vagina small amount of blood. Difficulty visualizing cervix due to patient's discomfort with speculum exam.  Unable to visualize IUD threads.      Assessment:   1. Postop check   2. Postpartum pain   3. Pelvic pain   4. Abnormal uterine bleeding, postpartum     Plan:    1. Wound care discussed.  Protruding stitch and staples removed. Steri-strips applied.  2. Discussed use of OTC lidocaine patches for incisional pain and  possibly back pain.  3. Activity restrictions: no bending, stooping, or squatting and no lifting more than 10-15 pounds 4. Patient reports heavy bleeding, however only small amount of bleeding noted on today's exam.  Will prescribe Methergine TID for 48 hrs to decrease flow.  Also will order pelvic ultrasound to assess IUD location as strings not visualized and back pain and heavy bleeding noted.  5.  Return if symptoms worsen or fail to improve, or for 6 wek postpartum visit.     Rubie Maid, MD [place]

## 2023-01-06 ENCOUNTER — Ambulatory Visit: Admission: RE | Admit: 2023-01-06 | Payer: Medicaid Other | Source: Ambulatory Visit

## 2023-01-09 NOTE — Progress Notes (Deleted)
     History of Present Illness:   Taylor Velazquez is a 27 y.o. [phone] female who presents for a 2 week televisit for mood check. She is 2 weeks postpartum following a classical cesarean delivery.  The delivery was at 36.4 gestational weeks.  Postpartum course has been well so far. Baby is feeding by ***. Bleeding: ***. Postpartum depression screening: negative.  EDPS score is 0.      The following portions of the patient's history were reviewed and updated as appropriate: allergies, current medications, past family history, past medical history, past social history, past surgical history, and problem list.   Observations/Objective:   not currently breastfeeding. Gen App: NAD Psych: normal speech, affect. Good mood.        12/07/2022    8:10 PM  Edinburgh Postnatal Depression Scale Screening Tool  I have been able to laugh and see the funny side of things. 0  I have looked forward with enjoyment to things. 0  I have blamed myself unnecessarily when things went wrong. 0  I have been anxious or worried for no good reason. 0  I have felt scared or panicky for no good reason. 0  Things have been getting on top of me. 0  I have been so unhappy that I have had difficulty sleeping. 0  I have felt sad or miserable. 0  I have been so unhappy that I have been crying. 0  The thought of harming myself has occurred to me. 0  Edinburgh Postnatal Depression Scale Total 0         Assessment and Plan:   1. Encounter for screening for maternal depression - Screening {FINDINGS; POSITIVE NEGATIVE:[phone]} today. Will rescreen at 6 week postpartum visit. Overall doing well.    2. Postpartum state - Overall doing well. Continue routine postpartum home care.    3. Lactating mother -    Follow Up Instructions:     I discussed the assessment and treatment plan with the patient. The patient was provided an opportunity to ask questions and all were answered. The patient agreed with the  plan and demonstrated an understanding of the instructions.   The patient was advised to call back or seek an in-person evaluation if the symptoms worsen or if the condition fails to improve as anticipated.      Rubie Maid, MD [facility]

## 2023-01-11 ENCOUNTER — Telehealth: Payer: Self-pay | Admitting: Obstetrics and Gynecology

## 2023-01-11 ENCOUNTER — Ambulatory Visit: Payer: Medicaid Other | Admitting: Obstetrics and Gynecology

## 2023-01-11 NOTE — Telephone Encounter (Signed)
Reached out to pt to reschedule 6 week PP visit that was scheduled with Dr. Marcelline Mates on 01/11/2023 at 1:35.  Left message for pt to call back.

## 2023-01-12 ENCOUNTER — Encounter: Payer: Self-pay | Admitting: Obstetrics and Gynecology

## 2023-01-12 NOTE — Telephone Encounter (Signed)
Reached out to pt to reschedule 6 week PP visit that was scheduled with Dr. Marcelline Mates on 01/11/2023 at 1:35.  Left message for pt to call back.  Will send a MyChart letter.

## 2023-01-17 NOTE — Telephone Encounter (Signed)
She unfortunately missed her postpartum appointment with me last week, so can get rescheduled and we can work on removing her IUD and I can take a look at her incision

## 2023-01-18 NOTE — Telephone Encounter (Signed)
The patient was rescheduled for 3/19 with Dr. Marcelline Mates

## 2023-01-25 ENCOUNTER — Telehealth: Payer: Self-pay

## 2023-01-25 NOTE — Telephone Encounter (Signed)
[facility]- Discharge Call [facility] with the pt about the following below. 1-Do you have any questions or concerns about yourself as you heal?  Yes-Still bleeding.  Not mush though.  Pt has upcoming follow-up with OBGYN. 2-Any concerns or questions about your baby?No.  Had been 3 times to see Peds since delivery. 3-Reviewed ABC's of safe sleep. 4-How was your stay at the hospital?Good.  Except she did not feel like she got discharge information like she did her other kids.   5-How did our team work together to care for you?Good.  The nurse during her C-Sec was really nice.   You should be receiving a survey in the mail soon.   We would really appreciate it if you could fill that out for [place] and return it in the mail.  We value the feedback to make improvements and continue the great work we do.   If you have any questions please feel free to call me back at [phone]

## 2023-02-14 ENCOUNTER — Telehealth: Payer: Self-pay | Admitting: Obstetrics and Gynecology

## 2023-02-14 ENCOUNTER — Ambulatory Visit: Payer: Medicaid Other | Admitting: Obstetrics and Gynecology

## 2023-02-14 NOTE — Progress Notes (Deleted)
   OBSTETRICS POSTPARTUM CLINIC PROGRESS NOTE  Subjective:     Taylor Velazquez is a 27 y.o. [phone] female who presents for a postpartum visit. She is 10 weeks postpartum following a C-Section, Classical . I have fully reviewed the prenatal and intrapartum course. The delivery was at 36.4 gestational weeks.  Anesthesia: spinal. Postpartum course has been ***. Baby's course has been ***. Baby is feeding by {breast/bottle:69}. Bleeding: patient {HAS HAS CG:[id number] not resumed menses, with No LMP recorded.. Bowel function is {normal:32111}. Bladder function is {normal:32111}. Patient {is/is not:9024} sexually active. Contraception method desired is {contraceptive method:5051}. Postpartum depression screening: {neg default:13464::"negative"}.  EDPS score is ***.    The following portions of the patient's history were reviewed and updated as appropriate: allergies, current medications, past family history, past medical history, past social history, past surgical history, and problem list.  Review of Systems {ros; complete:30496}   Objective:    There were no vitals taken for this visit.  General:  alert and no distress   Breasts:  inspection negative, no nipple discharge or bleeding, no masses or nodularity palpable  Lungs: clear to auscultation bilaterally  Heart:  regular rate and rhythm, S1, S2 normal, no murmur, click, rub or gallop  Abdomen: soft, non-tender; bowel sounds normal; no masses,  no organomegaly.  ***Well healed Pfannenstiel incision   Vulva:  normal  Vagina: normal vagina, no discharge, exudate, lesion, or erythema  Cervix:  no cervical motion tenderness and no lesions  Corpus: normal size, contour, position, consistency, mobility, non-tender  Adnexa:  normal adnexa and no mass, fullness, tenderness  Rectal Exam: Not performed.         Labs:  Lab Results  Component Value Date   HGB 7.9 (L) 12/07/2022     Assessment:   No diagnosis found.   Plan:    1.  Contraception: {method:5051} 2. Will check Hgb for h/o postpartum anemia of less than 10.  3. Follow up in: {1-10:13787} {time; units:19136} or as needed.     Rubie Maid, MD [place]

## 2023-02-14 NOTE — Telephone Encounter (Signed)
Reached out to pt to reschedule 6 wk pp visit that was scheduled with Dr. Marcelline Mates on 02/14/2023 at 2:35.  Left message for pt to call back to reschedule.

## 2023-02-21 ENCOUNTER — Telehealth: Payer: Self-pay

## 2023-02-21 NOTE — Telephone Encounter (Signed)
Pt calling; her blood sugar was really high this morning; was told to call if ever elevated.  [phone]  Called pt to see what exactly the number was; she wasn't really able to tell me - at first she said 'four hundred and something'; then 126; then 200 stating she really didn't know.  Also her feet are swollen and hurting on the top and is painful; adv to wear good supportive tennis shoes, watch salt intake and drink 64oz water a day.;  Adv would write up msg and send to [facility].

## 2023-02-21 NOTE — Telephone Encounter (Signed)
LMTC

## 2023-02-21 NOTE — Telephone Encounter (Signed)
Please inform that I wasn't aware of any diabetes issues in her history or in her pregnancies. If this is a new issue she would likely need to be seen for this, or be established with a PCP to manage. I agree with recommendations for feet swelling. If she can have her blood pressures checked that would be helpful as well. Can go to her local pharmacy or fire station if she is not able to go come for a BP check in the office.

## 2023-02-22 NOTE — Telephone Encounter (Signed)
The patient is scheduled for 4/18 with [facility]

## 2023-03-16 ENCOUNTER — Ambulatory Visit: Payer: Medicaid Other | Admitting: Obstetrics and Gynecology

## 2023-03-16 NOTE — Progress Notes (Deleted)
GYNECOLOGY ANNUAL PHYSICAL EXAM PROGRESS NOTE  Subjective:    Taylor Velazquez is a 27 y.o. [phone] female who presents for an annual exam. The patient has no complaints today. The patient {is/is not/has never been:13135} sexually active. The patient participates in regular exercise: {yes/no/not asked:9010}. Has the patient ever been transfused or tattooed?: {yes/no/not asked:9010}. The patient reports that there {is/is not:9024} domestic violence in her life.    Menstrual History: Menarche age: *** No LMP recorded.     Gynecologic History:  Contraception: {method:5051} History of STI's:  Last Pap: ***. Results were: {norm/abn:16337}.  ***Denies/Notes h/o abnormal pap smears. Last mammogram: ***. Results were: {norm/abn:16337}       OB History  Gravida Para Term Preterm AB Living  0 7  SAB IAB Ectopic Multiple Live Births  0 0 0 1 7    # Outcome Date GA Lbr Len/2nd Weight Sex Delivery Anes PTL Lv  6 Preterm 12/06/22 [redacted]w[redacted]d  7 lb 5.1 oz (3.32 kg) M CS-Classical Spinal  LIV     Name: Broadhead,BOY Nadiya     Apgar1: 8  Apgar5: 9  5 Term 08/22/18    M CS-LTranv   LIV  4 Term 03/30/16    M CS-LTranv   LIV  3 Term 01/01/15    M CS-LTranv   LIV  2A Term 01/03/14 [redacted]w[redacted]d   M CS-LTranv  N LIV  2B Term 01/03/14 [redacted]w[redacted]d   F CS-Unspec   LIV  1 Term 01/08/13    Wandalee Ferdinand   LIV    Past Medical History:  Diagnosis Date   Abdominal adhesions    dense   Gestational hypertension    Obesity     Past Surgical History:  Procedure Laterality Date   CESAREAN SECTION  2014   CESAREAN SECTION  2015   CESAREAN SECTION  2016   CESAREAN SECTION  2017   CESAREAN SECTION  2019   CESAREAN SECTION N/A 12/06/2022   Procedure: REPEAT CESAREAN SECTION;  Surgeon: Hildred Laser, MD;  Location: [facility] ORS;  Service: Obstetrics;  Laterality: N/A;    Family History  Problem Relation Age of Onset   Hypertension Father    Asthma Father    Cancer Neg Hx    Diabetes Neg Hx    Heart  disease Neg Hx    Stroke Neg Hx     Social History   Socioeconomic History   Marital status: Significant Other    Spouse name: Not on file   Number of children: 5   Years of education: Not on file   Highest education level: Not on file  Occupational History   Not on file  Tobacco Use   Smoking status: Never   Smokeless tobacco: Never  Vaping Use   Vaping Use: Never used  Substance and Sexual Activity   Alcohol use: Never   Drug use: Never   Sexual activity: Yes    Birth control/protection: None    Comment: Pt reports wanting IUD after this pregnancy  Other Topics Concern   Not on file  Social History Narrative   Lives with children   Social Determinants of Health   Financial Resource Strain: Not on file  Food Insecurity: No Food Insecurity (12/06/2022)   Hunger Vital Sign    Worried About Running Out of Food in the Last Year: Never true    Ran Out of Food in the Last Year: Never true  Transportation Needs: No Transportation Needs (12/06/2022)  PRAPARE - Administrator, Civil Service (Medical): No    Lack of Transportation (Non-Medical): No  Physical Activity: Not on file  Stress: Not on file  Social Connections: Not on file  Intimate Partner Violence: Not At Risk (12/06/2022)   Humiliation, Afraid, Rape, and Kick questionnaire    Fear of Current or Ex-Partner: No    Emotionally Abused: No    Physically Abused: No    Sexually Abused: No    Current Outpatient Medications on File Prior to Visit  Medication Sig Dispense Refill   ferrous sulfate 325 (65 FE) MG tablet Take 1 tablet (325 mg total) by mouth every other day. 30 tablet 1   ibuprofen (ADVIL) 600 MG tablet Take 1 tablet (600 mg total) by mouth every 6 (six) hours. 30 tablet 0   oxyCODONE-acetaminophen (PERCOCET/ROXICET) 5-325 MG tablet Take 1-2 tablets by mouth every 6 (six) hours as needed for severe pain or moderate pain ((when tolerating fluids)). 20 tablet 0   prenatal vitamin w/FE, FA (PRENATAL  1 + 1) 27-1 MG TABS tablet Take 1 tablet by mouth daily at 12 noon. 30 tablet 11   No current facility-administered medications on file prior to visit.    No Known Allergies   Review of Systems Constitutional: negative for chills, fatigue, fevers and sweats Eyes: negative for irritation, redness and visual disturbance Ears, nose, mouth, throat, and face: negative for hearing loss, nasal congestion, snoring and tinnitus Respiratory: negative for asthma, cough, sputum Cardiovascular: negative for chest pain, dyspnea, exertional chest pressure/discomfort, irregular heart beat, palpitations and syncope Gastrointestinal: negative for abdominal pain, change in bowel habits, nausea and vomiting Genitourinary: negative for abnormal menstrual periods, genital lesions, sexual problems and vaginal discharge, dysuria and urinary incontinence Integument/breast: negative for breast lump, breast tenderness and nipple discharge Hematologic/lymphatic: negative for bleeding and easy bruising Musculoskeletal:negative for back pain and muscle weakness Neurological: negative for dizziness, headaches, vertigo and weakness Endocrine: negative for diabetic symptoms including polydipsia, polyuria and skin dryness Allergic/Immunologic: negative for hay fever and urticaria      Objective:  not currently breastfeeding. There is no height or weight on file to calculate BMI.    General Appearance:    Alert, cooperative, no distress, appears stated age  Head:    Normocephalic, without obvious abnormality, atraumatic  Eyes:    PERRL, conjunctiva/corneas clear, EOM's intact, both eyes  Ears:    Normal external ear canals, both ears  Nose:   Nares normal, septum midline, mucosa normal, no drainage or sinus tenderness  Throat:   Lips, mucosa, and tongue normal; teeth and gums normal  Neck:   Supple, symmetrical, trachea midline, no adenopathy; thyroid: no enlargement/tenderness/nodules; no carotid bruit or JVD   Back:     Symmetric, no curvature, ROM normal, no CVA tenderness  Lungs:     Clear to auscultation bilaterally, respirations unlabored  Chest Wall:    No tenderness or deformity   Heart:    Regular rate and rhythm, S1 and S2 normal, no murmur, rub or gallop  Breast Exam:    No tenderness, masses, or nipple abnormality  Abdomen:     Soft, non-tender, bowel sounds active all four quadrants, no masses, no organomegaly.    Genitalia:    Pelvic:external genitalia normal, vagina without lesions, discharge, or tenderness, rectovaginal septum  normal. Cervix normal in appearance, no cervical motion tenderness, no adnexal masses or tenderness.  Uterus normal size, shape, mobile, regular contours, nontender.  Rectal:    Normal external  sphincter.  No hemorrhoids appreciated. Internal exam not done.   Extremities:   Extremities normal, atraumatic, no cyanosis or edema  Pulses:   2+ and symmetric all extremities  Skin:   Skin color, texture, turgor normal, no rashes or lesions  Lymph nodes:   Cervical, supraclavicular, and axillary nodes normal  Neurologic:   CNII-XII intact, normal strength, sensation and reflexes throughout   .  Labs:  Lab Results  Component Value Date   WBC 10.5 12/07/2022   HGB 7.9 (L) 12/07/2022   HCT 25.0 (L) 12/07/2022   MCV 84.2 12/07/2022   PLT 88 (L) 12/07/2022    Lab Results  Component Value Date   CREATININE 0.54 (L) 07/15/2022   BUN 6 07/15/2022   NA 140 07/15/2022   K 3.9 07/15/2022   CL 106 07/15/2022   CO2 18 (L) 07/15/2022    Lab Results  Component Value Date   ALT 28 07/15/2022   AST 26 07/15/2022   ALKPHOS 48 07/15/2022   BILITOT 0.3 07/15/2022    Lab Results  Component Value Date   TSH 1.180 07/15/2022     Assessment:   No diagnosis found.   Plan:  Blood tests: {blood tests:13147}. Breast self exam technique reviewed and patient encouraged to perform self-exam monthly. Contraception: {contraceptive methods:5051}. Discussed healthy  lifestyle modifications. Mammogram {discussed/ordered:14545} Pap smear {discussed/ordered:14545}. COVID vaccination status: Follow up in 1 year for annual exam   Tommie Raymond, CMA [facility]

## 2023-03-23 ENCOUNTER — Telehealth: Payer: Self-pay | Admitting: Obstetrics and Gynecology

## 2023-03-23 NOTE — Telephone Encounter (Signed)
Called patient to rs her pp visit didn't get a answer so had to leave a message to call office back. Patient has cancel or no showed a couple of appointments in the past.

## 2023-03-28 NOTE — Telephone Encounter (Signed)
Patient is scheduled on 03/29/2023 at 10:55 am

## 2023-03-29 ENCOUNTER — Ambulatory Visit: Payer: Medicaid Other | Admitting: Certified Nurse Midwife

## 2023-04-16 NOTE — Progress Notes (Deleted)
   No chief complaint on file.    History of Present Illness:  Taylor Velazquez is a 27 y.o. that had a {IUD:23561} IUD placed approximately {NUMBERS:20191} {MONTH/YR:310907} ago. Since that time, she denies dyspareunia, pelvic pain, non-menstrual bleeding, vaginal d/c, heavy bleeding.    There were no vitals taken for this visit.  Pelvic exam:  Two IUD strings {DESC; PRESENT/ABSENT:17923} seen coming from the cervical os. EGBUS, vaginal vault and cervix: within normal limits  IUD Removal Strings of IUD identified and grasped.  IUD removed without problem with ring forceps.  Pt tolerated this well.  IUD noted to be intact.  Assessment:  No diagnosis found.    Plan: IUD removed and plan for contraception is {PLAN CONTRACEPTION:313102}. She was amenable to this plan.  Tequia Wolman B. Dijuan Sleeth, PA-C 04/16/2023 6:53 PM

## 2023-04-17 ENCOUNTER — Ambulatory Visit: Payer: Medicaid Other | Admitting: Obstetrics and Gynecology

## 2023-04-18 ENCOUNTER — Ambulatory Visit (INDEPENDENT_AMBULATORY_CARE_PROVIDER_SITE_OTHER): Payer: Medicaid Other | Admitting: Obstetrics and Gynecology

## 2023-04-18 ENCOUNTER — Encounter: Payer: Self-pay | Admitting: Obstetrics and Gynecology

## 2023-04-18 ENCOUNTER — Other Ambulatory Visit (HOSPITAL_COMMUNITY)
Admission: RE | Admit: 2023-04-18 | Discharge: 2023-04-18 | Disposition: A | Discharge status: Home / Self Care | Payer: Medicaid Other | Source: Ambulatory Visit | Attending: Obstetrics and Gynecology | Admitting: Obstetrics and Gynecology

## 2023-04-18 VITALS — BP 127/90 | HR 75 | Resp 16 | Ht 69.0 in | Wt 227.0 lb

## 2023-04-18 DIAGNOSIS — Z87440 Personal history of urinary (tract) infections: Secondary | ICD-10-CM

## 2023-04-18 DIAGNOSIS — Z124 Encounter for screening for malignant neoplasm of cervix: Secondary | ICD-10-CM | POA: Diagnosis present

## 2023-04-18 DIAGNOSIS — T8332XA Displacement of intrauterine contraceptive device, initial encounter: Secondary | ICD-10-CM | POA: Diagnosis not present

## 2023-04-18 DIAGNOSIS — N898 Other specified noninflammatory disorders of vagina: Secondary | ICD-10-CM | POA: Insufficient documentation

## 2023-04-18 DIAGNOSIS — Z538 Procedure and treatment not carried out for other reasons: Secondary | ICD-10-CM

## 2023-04-18 LAB — POCT URINALYSIS DIPSTICK
Bilirubin, UA: NEGATIVE
Blood, UA: POSITIVE
Glucose, UA: NEGATIVE
Ketones, UA: NEGATIVE
Nitrite, UA: NEGATIVE
Protein, UA: POSITIVE — AB
Spec Grav, UA: 1.02 (ref 1.010–1.025)
Urobilinogen, UA: 0.2 E.U./dL
pH, UA: 6 (ref 5.0–8.0)

## 2023-04-18 NOTE — Patient Instructions (Signed)
GYNECOLOGY PRE-OPERATIVE INSTRUCTIONS  You are scheduled for surgery on 05/01/2023.  The name of your procedure is: Hysteroscopy with IUD removal.   Please read through these instructions carefully regarding preparation for your surgery: Nothing to eat after midnight on the day prior to surgery.  Do not take any medications unless recommended by your provider on day prior to surgery.  Do not take NSAIDs (Motrin, Aleve) or aspirin 7 days prior to surgery.  You may take Tylenol products for minor aches and pains.  You will receive a prescription for pain medications post-operatively.  You will be contacted by phone approximately 1-2 weeks prior to surgery to schedule your pre-operative appointment.  Please call the office if you have any questions regarding your upcoming surgery.    Thank you for choosing [facility] at [facility]

## 2023-04-18 NOTE — Progress Notes (Signed)
GYNECOLOGY PROGRESS NOTE  Subjective:    Patient ID: Taylor Velazquez, female    DOB: [birth year], 27 y.o.   MRN: [id number]  HPI  Patient is a 27 y.o. [phone] female who presents for IUD removal. Had immediate PP IUD insertion after last C-section performed in January 2024.  Notes that she has had pain and bleeding since its insertion. States that the same thing happened last pregnancy after IUD placement, and had to have it removed in the operating room due to lost IUD threads and intolerance to removal in the office. After removal, patient notes she will take OCPs.   Ladaija also desires to be checked for a UTI. Notes that she was seen at Urgent Care 2 weeks ago due to UTI symptoms, and was given prescription for an antibiotic which she completed, however is still having symptoms (mostly dark colored urine and odor, despite increasing her water intake).    The following portions of the patient's history were reviewed and updated as appropriate: allergies, current medications, past family history, past medical history, past social history, past surgical history, and problem list.  Review of Systems Pertinent items noted in HPI and remainder of comprehensive ROS otherwise negative.   Objective:   Blood pressure (!) 127/90, pulse 75, resp. rate 16, height 5\' 9"  (1.753 m), weight 227 lb (103 kg), not currently breastfeeding. Body mass index is 33.52 kg/m. General appearance: alert and no distress Abdomen: soft, non-tender; bowel sounds normal; no masses,  no organomegaly Pelvic: external genitalia normal, rectovaginal septum normal.  Vagina with moderate malodorous yellow-pink discharge.  Cervix nulliparous and otherwise normal appearing, very posterior, no lesions and mild motion tenderness, no IUD threads visualized.  Attempted to retrieve with tonsil forceps however due to patient's intolerance of exam discontinued procedure.. Bimanual exam not performed.  Extremities: extremities normal,  atraumatic, no cyanosis or edema Neurologic: Grossly normal    Labs:  Results for orders placed or performed in visit on 04/18/23  POCT Urinalysis Dipstick  Result Value Ref Range   Color, UA     Clarity, UA     Glucose, UA Negative Negative   Bilirubin, UA Negative    Ketones, UA Negative    Spec Grav, UA 1.020 1.010 - 1.025   Blood, UA Positive    pH, UA 6.0 5.0 - 8.0   Protein, UA Positive (A) Negative   Urobilinogen, UA 0.2 0.2 or 1.0 E.U./dL   Nitrite, UA Negative    Leukocytes, UA Trace (A) Negative   Appearance     Odor      Assessment:   1. Attempted IUD removal, unsuccessful   2. Intrauterine contraceptive device threads lost, initial encounter   3. History of UTI   4. Cervical cancer screening   5. Vaginal discharge      Plan:   1. Attempted IUD removal, unsuccessful - Attempted IUD removal however unsuccessful due to lost IUD threads and patient's intolerance of exam.  Discussed option of trying IUD removal again under hysteroscopic guidance in the office under light sedation, versus in the operating room under full sedation.  Patient notes she would prefer in the operating room.  Will have patient scheduled for IUD removal on 05/01/2023.  Preop done today.  2. Intrauterine contraceptive device threads lost, initial encounter -IUD not unable to be retrieved even with use of tonsil forceps.  Will plan for removal in OR.  3. History of UTI -Patient continues to report UTI symptoms despite recent treatment with antibiotics  for UTI.  UA today with some blood and trace leukocytes, however patient notes that she has been bleeding with her IUD so this may be more related to vaginal source. - POCT Urinalysis Dipstick - Urine Culture performed to rule out residual or resistant UTI  4. Cervical cancer screening -Patient overdue for cervical cancer screening, has a history of noncompliance with visits.  Will perform today. - Cytology - PAP  5. Vaginal  discharge -Malodorous discharge noted on today's exam.  Will screen for vaginitis, cervicitis. - Cervicovaginal ancillary only   IUD Removal (discontinued procedure) Patient identified, informed consent performed, consent signed.  Patient was placed in the dorsal lithotomy position, normal external genitalia was noted.  A speculum was placed in the patient's vagina, normal discharge was noted, no lesions. The cervix was visualized, no lesions, but abnormal discharge noted. The strings of the IUD were not visualized. Tonsil forceps were introduced into the endocervical canal however due to patient's intolerance to exam requested discontinuation of the procedure as IUD threads were not able to be retrieved after initial 2 passes.   Patient will plan for IUD removal in the OR. Will plan for use of OCPs after this.  Instructed on use of back up method for first 2 weeks after changing contraceptive methods.  Routine preventative health maintenance measures emphasized.    Hildred Laser, MD [facility]

## 2023-04-20 ENCOUNTER — Other Ambulatory Visit: Payer: Self-pay | Admitting: Obstetrics and Gynecology

## 2023-04-20 DIAGNOSIS — B3731 Acute candidiasis of vulva and vagina: Secondary | ICD-10-CM

## 2023-04-20 DIAGNOSIS — B9689 Other specified bacterial agents as the cause of diseases classified elsewhere: Secondary | ICD-10-CM

## 2023-04-20 LAB — CERVICOVAGINAL ANCILLARY ONLY
Bacterial Vaginitis (gardnerella): POSITIVE — AB
Candida Glabrata: NEGATIVE
Candida Vaginitis: POSITIVE — AB
Chlamydia: NEGATIVE
Comment: NEGATIVE
Comment: NEGATIVE
Comment: NEGATIVE
Comment: NEGATIVE
Comment: NEGATIVE
Comment: NORMAL
Neisseria Gonorrhea: NEGATIVE
Trichomonas: NEGATIVE

## 2023-04-20 LAB — URINE CULTURE

## 2023-04-20 MED ORDER — METRONIDAZOLE 500 MG PO TABS: 500.0000 mg | ORAL_TABLET | Freq: Two times a day (BID) | ORAL | 0 refills | Status: DC

## 2023-04-20 MED ORDER — FLUCONAZOLE 150 MG PO TABS: 150.0000 mg | ORAL_TABLET | Freq: Once | ORAL | 1 refills | Status: AC

## 2023-04-25 LAB — CYTOLOGY - PAP
Adequacy: ABSENT
Diagnosis: NEGATIVE

## 2023-04-27 ENCOUNTER — Other Ambulatory Visit: Payer: Self-pay

## 2023-04-27 ENCOUNTER — Inpatient Hospital Stay: Admission: RE | Admit: 2023-04-27 | Payer: Medicaid Other | Source: Ambulatory Visit

## 2023-04-27 ENCOUNTER — Encounter
Admission: RE | Admit: 2023-04-27 | Discharge: 2023-04-27 | Disposition: A | Discharge status: Home / Self Care | Payer: Medicaid Other | Source: Ambulatory Visit | Attending: Obstetrics and Gynecology | Admitting: Obstetrics and Gynecology

## 2023-04-27 DIAGNOSIS — Z01812 Encounter for preprocedural laboratory examination: Secondary | ICD-10-CM

## 2023-04-27 HISTORY — DX: Anemia, unspecified: D64.9

## 2023-04-27 NOTE — Patient Instructions (Addendum)
Your procedure is scheduled on: 05/01/23 - Monday Report to the Registration Desk on the 1st floor of the [facility]. To find out your arrival time, please call [phone] between 1PM - 3PM on: 04/28/23 - Friday If your arrival time is 6:00 am, do not arrive before that time as the [facility] entrance doors do not open until 6:00 am. [facility] 04/27/23 at 2 pm - [address] [place]  REMEMBER: Instructions that are not followed completely may result in serious medical risk, up to and including death; or upon the discretion of your surgeon and anesthesiologist your surgery may need to be rescheduled.  Do not eat food or drink any liquids after midnight the night before surgery.  No gum chewing or hard candies.   One week prior to surgery: Stop Anti-inflammatories (NSAIDS) such as Advil, Aleve, Ibuprofen, Motrin, Naproxen, Naprosyn and Aspirin based products such as Excedrin, Goody's Powder, BC Powder.  Stop ANY OVER THE COUNTER supplements until after surgery.  You may however, continue to take Tylenol if needed for pain up until the day of surgery.   TAKE ONLY THESE MEDICATIONS THE MORNING OF SURGERY WITH A SIP OF WATER:  NONE   No Alcohol for 24 hours before or after surgery.  No Smoking including e-cigarettes for 24 hours before surgery.  No chewable tobacco products for at least 6 hours before surgery.  No nicotine patches on the day of surgery.  Do not use any "recreational" drugs for at least a week (preferably 2 weeks) before your surgery.  Please be advised that the combination of cocaine and anesthesia may have negative outcomes, up to and including death. If you test positive for cocaine, your surgery will be cancelled.  On the morning of surgery brush your teeth with toothpaste and water, you may rinse your mouth with mouthwash if you wish. Do not swallow any toothpaste or mouthwash.  Use CHG Soap or wipes as directed on  instruction sheet.  Do not wear jewelry, make-up, hairpins, clips or nail polish.  Do not wear lotions, powders, or perfumes.   Do not shave body hair from the neck down 48 hours before surgery.  Contact lenses, hearing aids and dentures may not be worn into surgery.  Do not bring valuables to the hospital. [facility] is not responsible for any missing/lost belongings or valuables.   Notify your doctor if there is any change in your medical condition (cold, fever, infection).  Wear comfortable clothing (specific to your surgery type) to the hospital.  After surgery, you can help prevent lung complications by doing breathing exercises.  Take deep breaths and cough every 1-2 hours. Your doctor may order a device called an Incentive Spirometer to help you take deep breaths. When coughing or sneezing, hold a pillow firmly against your incision with both hands. This is called "splinting." Doing this helps protect your incision. It also decreases belly discomfort.  If you are being admitted to the hospital overnight, leave your suitcase in the car. After surgery it may be brought to your room.  In case of increased patient census, it may be necessary for you, the patient, to continue your postoperative care in the Same Day Surgery department.  If you are being discharged the day of surgery, you will not be allowed to drive home. You will need a responsible individual to drive you home and stay with you for 24 hours after surgery.   If you are  taking public transportation, you will need to have a responsible individual with you.  Please call the Pre-admissions Testing Dept. at [phone] if you have any questions about these instructions.  Surgery Visitation Policy:  Patients having surgery or a procedure may have two visitors.  Children under the age of 16 must have an adult with them who is not the patient.  Inpatient Visitation:    Visiting hours are 7 a.m. to 8 p.m. Up to  four visitors are allowed at one time in a patient room. The visitors may rotate out with other people during the day.  One visitor age 15 or older may stay with the patient overnight and must be in the room by 8 p.m.    Preparing for Surgery with CHLORHEXIDINE GLUCONATE (CHG) Soap  Chlorhexidine Gluconate (CHG) Soap  o An antiseptic cleaner that kills germs and bonds with the skin to continue killing germs even after washing  o Used for showering the night before surgery and morning of surgery  Before surgery, you can play an important role by reducing the number of germs on your skin.  CHG (Chlorhexidine gluconate) soap is an antiseptic cleanser which kills germs and bonds with the skin to continue killing germs even after washing.  Please do not use if you have an allergy to CHG or antibacterial soaps. If your skin becomes reddened/irritated stop using the CHG.  1. Shower the NIGHT BEFORE SURGERY and the MORNING OF SURGERY with CHG soap.  2. If you choose to wash your hair, wash your hair first as usual with your normal shampoo.  3. After shampooing, rinse your hair and body thoroughly to remove the shampoo.  4. Use CHG as you would any other liquid soap. You can apply CHG directly to the skin and wash gently with a scrungie or a clean washcloth.  5. Apply the CHG soap to your body only from the neck down. Do not use on open wounds or open sores. Avoid contact with your eyes, ears, mouth, and genitals (private parts). Wash face and genitals (private parts) with your normal soap.  6. Wash thoroughly, paying special attention to the area where your surgery will be performed.  7. Thoroughly rinse your body with warm water.  8. Do not shower/wash with your normal soap after using and rinsing off the CHG soap.  9. Pat yourself dry with a clean towel.  10. Wear clean pajamas to bed the night before surgery.  12. Place clean sheets on your bed the night of your first shower and do  not sleep with pets.  13. Shower again with the CHG soap on the day of surgery prior to arriving at the hospital.  14. Do not apply any deodorants/lotions/powders.  15. Please wear clean clothes to the hospital.

## 2023-05-01 ENCOUNTER — Other Ambulatory Visit: Payer: Self-pay

## 2023-05-01 ENCOUNTER — Encounter: Payer: Self-pay | Admitting: Obstetrics and Gynecology

## 2023-05-01 ENCOUNTER — Encounter
Admission: RE | Disposition: A | Discharge status: Home / Self Care | Payer: Self-pay | Source: Home / Self Care | Attending: Obstetrics and Gynecology

## 2023-05-01 ENCOUNTER — Ambulatory Visit
Admission: RE | Admit: 2023-05-01 | Discharge: 2023-05-01 | Disposition: A | Discharge status: Home / Self Care | Payer: Medicaid Other | Attending: Obstetrics and Gynecology | Admitting: Obstetrics and Gynecology

## 2023-05-01 ENCOUNTER — Ambulatory Visit: Payer: Medicaid Other | Admitting: Urgent Care

## 2023-05-01 ENCOUNTER — Ambulatory Visit: Payer: Medicaid Other | Admitting: Registered Nurse

## 2023-05-01 DIAGNOSIS — O34219 Maternal care for unspecified type scar from previous cesarean delivery: Secondary | ICD-10-CM

## 2023-05-01 DIAGNOSIS — T8332XA Displacement of intrauterine contraceptive device, initial encounter: Secondary | ICD-10-CM | POA: Insufficient documentation

## 2023-05-01 DIAGNOSIS — F17210 Nicotine dependence, cigarettes, uncomplicated: Secondary | ICD-10-CM | POA: Insufficient documentation

## 2023-05-01 DIAGNOSIS — O99810 Abnormal glucose complicating pregnancy: Secondary | ICD-10-CM

## 2023-05-01 DIAGNOSIS — D696 Thrombocytopenia, unspecified: Secondary | ICD-10-CM

## 2023-05-01 DIAGNOSIS — O99013 Anemia complicating pregnancy, third trimester: Secondary | ICD-10-CM

## 2023-05-01 DIAGNOSIS — Z30432 Encounter for removal of intrauterine contraceptive device: Secondary | ICD-10-CM | POA: Diagnosis not present

## 2023-05-01 DIAGNOSIS — O0990 Supervision of high risk pregnancy, unspecified, unspecified trimester: Secondary | ICD-10-CM

## 2023-05-01 DIAGNOSIS — D563 Thalassemia minor: Secondary | ICD-10-CM

## 2023-05-01 DIAGNOSIS — Z01812 Encounter for preprocedural laboratory examination: Secondary | ICD-10-CM

## 2023-05-01 DIAGNOSIS — Z8759 Personal history of other complications of pregnancy, childbirth and the puerperium: Secondary | ICD-10-CM

## 2023-05-01 DIAGNOSIS — R102 Pelvic and perineal pain: Secondary | ICD-10-CM | POA: Insufficient documentation

## 2023-05-01 DIAGNOSIS — Z9889 Other specified postprocedural states: Secondary | ICD-10-CM

## 2023-05-01 DIAGNOSIS — Y831 Surgical operation with implant of artificial internal device as the cause of abnormal reaction of the patient, or of later complication, without mention of misadventure at the time of the procedure: Secondary | ICD-10-CM | POA: Diagnosis not present

## 2023-05-01 DIAGNOSIS — Z7189 Other specified counseling: Secondary | ICD-10-CM

## 2023-05-01 HISTORY — PX: IUD REMOVAL: SHX5392

## 2023-05-01 LAB — CBC
HCT: 36.9 % (ref 36.0–46.0)
Hemoglobin: 11.4 g/dL — ABNORMAL LOW (ref 12.0–15.0)
MCH: 25.3 pg — ABNORMAL LOW (ref 26.0–34.0)
MCHC: 30.9 g/dL (ref 30.0–36.0)
MCV: 82 fL (ref 80.0–100.0)
Platelets: 158 10*3/uL (ref 150–400)
RBC: 4.5 MIL/uL (ref 3.87–5.11)
RDW: 15.9 % — ABNORMAL HIGH (ref 11.5–15.5)
WBC: 10.9 10*3/uL — ABNORMAL HIGH (ref 4.0–10.5)
nRBC: 0 % (ref 0.0–0.2)

## 2023-05-01 LAB — POCT PREGNANCY, URINE: Preg Test, Ur: NEGATIVE

## 2023-05-01 SURGERY — REMOVAL, INTRAUTERINE DEVICE
Anesthesia: General

## 2023-05-01 MED ORDER — FENTANYL CITRATE (PF) 100 MCG/2ML IJ SOLN
INTRAMUSCULAR | Status: DC | PRN
  Administered 2023-05-01 (×2): 25 ug via INTRAVENOUS

## 2023-05-01 MED ORDER — CELECOXIB 200 MG PO CAPS
ORAL_CAPSULE | ORAL | Status: AC
  Filled 2023-05-01: qty 1

## 2023-05-01 MED ORDER — PROPOFOL 10 MG/ML IV BOLUS
INTRAVENOUS | Status: AC
  Filled 2023-05-01: qty 20

## 2023-05-01 MED ORDER — FAMOTIDINE 20 MG PO TABS
ORAL_TABLET | ORAL | Status: AC
  Filled 2023-05-01: qty 1

## 2023-05-01 MED ORDER — GABAPENTIN 300 MG PO CAPS
ORAL_CAPSULE | ORAL | Status: AC
  Filled 2023-05-01: qty 1

## 2023-05-01 MED ORDER — CHLORHEXIDINE GLUCONATE 0.12 % MT SOLN
15.0000 mL | Freq: Once | OROMUCOSAL | Status: AC
  Administered 2023-05-01: 15 mL via OROMUCOSAL

## 2023-05-01 MED ORDER — OXYCODONE HCL 5 MG/5ML PO SOLN: 5.0000 mg | Freq: Once | ORAL | Status: DC | PRN

## 2023-05-01 MED ORDER — ACETAMINOPHEN 500 MG PO TABS
1000.0000 mg | ORAL_TABLET | ORAL | Status: AC
  Administered 2023-05-01: 1000 mg via ORAL

## 2023-05-01 MED ORDER — DEXAMETHASONE SODIUM PHOSPHATE 10 MG/ML IJ SOLN
INTRAMUSCULAR | Status: DC | PRN
  Administered 2023-05-01: 5 mg via INTRAVENOUS

## 2023-05-01 MED ORDER — ACETAMINOPHEN 10 MG/ML IV SOLN: 1000.0000 mg | Freq: Once | INTRAVENOUS | Status: DC | PRN

## 2023-05-01 MED ORDER — METRONIDAZOLE 500 MG PO TABS: 500.0000 mg | ORAL_TABLET | Freq: Two times a day (BID) | ORAL | 0 refills | Status: AC

## 2023-05-01 MED ORDER — GLYCOPYRROLATE 0.2 MG/ML IJ SOLN
INTRAMUSCULAR | Status: AC
  Filled 2023-05-01: qty 1

## 2023-05-01 MED ORDER — DEXMEDETOMIDINE HCL IN NACL 200 MCG/50ML IV SOLN
INTRAVENOUS | Status: DC | PRN
  Administered 2023-05-01: 4 ug via INTRAVENOUS
  Administered 2023-05-01 (×2): 8 ug via INTRAVENOUS
  Administered 2023-05-01: 4 ug via INTRAVENOUS

## 2023-05-01 MED ORDER — OXYCODONE HCL 5 MG PO TABS: 5.0000 mg | ORAL_TABLET | Freq: Once | ORAL | Status: DC | PRN

## 2023-05-01 MED ORDER — FAMOTIDINE 20 MG PO TABS
20.0000 mg | ORAL_TABLET | Freq: Once | ORAL | Status: AC
  Administered 2023-05-01: 20 mg via ORAL

## 2023-05-01 MED ORDER — ONDANSETRON HCL 4 MG/2ML IJ SOLN: 4.0000 mg | Freq: Once | INTRAMUSCULAR | Status: DC | PRN

## 2023-05-01 MED ORDER — SODIUM CHLORIDE 0.9 % IR SOLN
Status: DC | PRN
  Administered 2023-05-01: 1000 mL

## 2023-05-01 MED ORDER — IBUPROFEN 600 MG PO TABS: 600.0000 mg | ORAL_TABLET | Freq: Four times a day (QID) | ORAL | 0 refills | Status: AC | PRN

## 2023-05-01 MED ORDER — ONDANSETRON HCL 4 MG/2ML IJ SOLN
INTRAMUSCULAR | Status: AC
  Filled 2023-05-01: qty 2

## 2023-05-01 MED ORDER — ORAL CARE MOUTH RINSE: 15.0000 mL | Freq: Once | OROMUCOSAL | Status: AC

## 2023-05-01 MED ORDER — LACTATED RINGERS IV SOLN: INTRAVENOUS | Status: DC

## 2023-05-01 MED ORDER — FENTANYL CITRATE (PF) 100 MCG/2ML IJ SOLN
INTRAMUSCULAR | Status: AC
  Filled 2023-05-01: qty 2

## 2023-05-01 MED ORDER — 0.9 % SODIUM CHLORIDE (POUR BTL) OPTIME
TOPICAL | Status: DC | PRN
  Administered 2023-05-01: 500 mL

## 2023-05-01 MED ORDER — SILVER NITRATE-POT NITRATE 75-25 % EX MISC
CUTANEOUS | Status: AC
  Filled 2023-05-01: qty 10

## 2023-05-01 MED ORDER — LIDOCAINE HCL (PF) 2 % IJ SOLN
INTRAMUSCULAR | Status: AC
  Filled 2023-05-01: qty 5

## 2023-05-01 MED ORDER — CHLORHEXIDINE GLUCONATE 0.12 % MT SOLN
OROMUCOSAL | Status: AC
  Filled 2023-05-01: qty 15

## 2023-05-01 MED ORDER — FLUCONAZOLE 150 MG PO TABS: 150.0000 mg | ORAL_TABLET | Freq: Once | ORAL | 0 refills | Status: AC

## 2023-05-01 MED ORDER — CELECOXIB 200 MG PO CAPS
400.0000 mg | ORAL_CAPSULE | ORAL | Status: AC
  Administered 2023-05-01: 400 mg via ORAL

## 2023-05-01 MED ORDER — MIDAZOLAM HCL 2 MG/2ML IJ SOLN
INTRAMUSCULAR | Status: DC | PRN
  Administered 2023-05-01: 2 mg via INTRAVENOUS

## 2023-05-01 MED ORDER — MIDAZOLAM HCL 2 MG/2ML IJ SOLN
INTRAMUSCULAR | Status: AC
  Filled 2023-05-01: qty 2

## 2023-05-01 MED ORDER — FENTANYL CITRATE (PF) 100 MCG/2ML IJ SOLN: 25.0000 ug | INTRAMUSCULAR | Status: DC | PRN

## 2023-05-01 MED ORDER — ONDANSETRON HCL 4 MG/2ML IJ SOLN
INTRAMUSCULAR | Status: DC | PRN
  Administered 2023-05-01: 4 mg via INTRAVENOUS

## 2023-05-01 MED ORDER — NORETHIN ACE-ETH ESTRAD-FE 1.5-30 MG-MCG PO TABS: 1.0000 | ORAL_TABLET | Freq: Every day | ORAL | 3 refills | Status: AC

## 2023-05-01 MED ORDER — GABAPENTIN 300 MG PO CAPS
300.0000 mg | ORAL_CAPSULE | ORAL | Status: AC
  Administered 2023-05-01: 300 mg via ORAL

## 2023-05-01 MED ORDER — ACETAMINOPHEN 500 MG PO TABS
ORAL_TABLET | ORAL | Status: AC
  Filled 2023-05-01: qty 2

## 2023-05-01 MED ORDER — DEXAMETHASONE SODIUM PHOSPHATE 10 MG/ML IJ SOLN
INTRAMUSCULAR | Status: AC
  Filled 2023-05-01: qty 1

## 2023-05-01 MED ORDER — LIDOCAINE HCL (CARDIAC) PF 100 MG/5ML IV SOSY
PREFILLED_SYRINGE | INTRAVENOUS | Status: DC | PRN
  Administered 2023-05-01: 40 mg via INTRAVENOUS

## 2023-05-01 MED ORDER — PROPOFOL 10 MG/ML IV BOLUS
INTRAVENOUS | Status: DC | PRN
  Administered 2023-05-01: 150 ug/kg/min via INTRAVENOUS

## 2023-05-01 SURGICAL SUPPLY — 21 items
CUP MEDICINE 2OZ PLAST GRAD ST (MISCELLANEOUS) ×1 IMPLANT
DRAPE UNDER BUTTOCK W/FLU (DRAPES) ×1 IMPLANT
DRSG TELFA 3X8 NADH STRL (GAUZE/BANDAGES/DRESSINGS) ×1 IMPLANT
GLOVE BIO SURGEON STRL SZ 6.5 (GLOVE) ×1 IMPLANT
GLOVE INDICATOR 7.0 STRL GRN (GLOVE) ×1 IMPLANT
GOWN STRL REUS W/ TWL LRG LVL3 (GOWN DISPOSABLE) ×2 IMPLANT
GOWN STRL REUS W/TWL LRG LVL3 (GOWN DISPOSABLE) ×2
IV NS 1000ML (IV SOLUTION) ×1
IV NS 1000ML BAXH (IV SOLUTION) IMPLANT
KIT TURNOVER CYSTO (KITS) ×1 IMPLANT
MANIFOLD NEPTUNE II (INSTRUMENTS) ×1 IMPLANT
PACK DNC HYST (MISCELLANEOUS) ×1 IMPLANT
PAD OB MATERNITY 4.3X12.25 (PERSONAL CARE ITEMS) ×1 IMPLANT
PAD PREP OB/GYN DISP 24X41 (PERSONAL CARE ITEMS) ×1 IMPLANT
SCRUB CHG 4% DYNA-HEX 4OZ (MISCELLANEOUS) ×1 IMPLANT
SET CYSTO W/LG BORE CLAMP LF (SET/KITS/TRAYS/PACK) IMPLANT
SOL PREP PVP 2OZ (MISCELLANEOUS) ×1
SOLUTION PREP PVP 2OZ (MISCELLANEOUS) ×1 IMPLANT
TOWEL OR 17X26 4PK STRL BLUE (TOWEL DISPOSABLE) ×1 IMPLANT
TRAP FLUID SMOKE EVACUATOR (MISCELLANEOUS) ×1 IMPLANT
WATER STERILE IRR 500ML POUR (IV SOLUTION) ×1 IMPLANT

## 2023-05-01 NOTE — Anesthesia Postprocedure Evaluation (Signed)
Anesthesia Post Note  Patient: Taylor Velazquez  Procedure(s) Performed: HYSTEROSCOPIC INTRAUTERINE DEVICE (IUD) REMOVAL  Patient location during evaluation: PACU Anesthesia Type: General Level of consciousness: awake and alert Pain management: pain level controlled Vital Signs Assessment: post-procedure vital signs reviewed and stable Respiratory status: spontaneous breathing, nonlabored ventilation, respiratory function stable and patient connected to nasal cannula oxygen Cardiovascular status: blood pressure returned to baseline and stable Postop Assessment: no apparent nausea or vomiting Anesthetic complications: no   No notable events documented.   Last Vitals:  Vitals:   05/01/23 1115 05/01/23 1130  BP: (!) 121/90 127/88  Pulse: 71 69  Resp: 19 15  Temp: 36.9 C   SpO2: 95% 95%    Last Pain:  Vitals:   05/01/23 1130  TempSrc:   PainSc: Asleep                 Corinda Gubler

## 2023-05-01 NOTE — Op Note (Signed)
Procedure(s): HYSTEROSCOPIC INTRAUTERINE DEVICE (IUD) REMOVAL Procedure Note  Taylor Velazquez female 27 y.o. 05/01/2023  Indications: The patient is a 27 y.o. [phone] female with lost IUD threads, pelvic pain  Pre-operative Diagnosis: Lost IUD threads, pelvic pain  Post-operative Diagnosis: Same  Surgeon: Hildred Laser, MD  Assistants:  None.   Anesthesia: General endotracheal anesthesia  Findings: The uterus was sounded to 9 cm Weakly proliferative endometrium.  Tubal ostia unable to be identified bilaterally due to narrowing of endometrial cavity.  No intrauterine masses.   Procedure Details: The patient was seen in the Holding Room. The risks, benefits, complications, treatment options, and expected outcomes were discussed with the patient.  The patient concurred with the proposed plan, giving informed consent.  The site of surgery properly noted/marked. The patient was taken to the Operating Room, identified as Editor, commissioning and the procedure verified as Procedure(s) (LRB): HYSTEROSCOPIC INTRAUTERINE DEVICE (IUD) REMOVAL (N/A). A Time Out was held and the above information confirmed.  She was then placed under general anesthesia without difficulty. She was placed in the dorsal lithotomy position, and was prepped and draped in a sterile manner.  A straight catheterization was performed. A sterile speculum was inserted into the vagina and the cervix was grasped at the anterior lip using a single-toothed tenaculum.  The uterus was sounded to 9 cm.  Cervical dilation was performed. Polyp forceps were introduced into the uterine cavity to attempt blind retrieval without success. A 5 mm hysteroscope was introduced into the uterus under direct visualization. The cavity was allowed to fill, and then the entire cavity was explored with the findings described above. A hysteroscopic grasper was then inserted and the IUD threads were grasped. The entire IUD was removed without difficulty, intact.   The hysteroscope was removed from the patient's uterine cavity. The tenaculum was removed and excellent hemostasis was noted. The speculum was removed from the vagina.   All instrument and sponge counts were correct at the end of the procedure x 2.  The patient tolerated the procedure well.  She was awakened and taken to the PACU in stable condition.    Estimated Blood Loss:  5 ml      Drains: straight catheterization prior to procedure with 100 ml of clear urine at start of procedure.          Total IV Fluids:  500 ml  Fluid Deficit: 400 ml  Specimens: IUD, not sent for evaluation         Implants: None         Complications:  None; patient tolerated the procedure well.         Disposition: PACU - hemodynamically stable.         Condition: stable   Hildred Laser, MD [facility] at [facility]

## 2023-05-01 NOTE — Transfer of Care (Signed)
Immediate Anesthesia Transfer of Care Note  Patient: Taylor Velazquez  Procedure(s) Performed: Procedure(s): HYSTEROSCOPIC INTRAUTERINE DEVICE (IUD) REMOVAL (N/A)  Patient Location: PACU  Anesthesia Type:General  Level of Consciousness: sedated  Airway & Oxygen Therapy: Patient Spontanous Breathing and Patient connected to face mask oxygen  Post-op Assessment: Report given to RN and Post -op Vital signs reviewed and stable  Post vital signs: Reviewed and stable  Last Vitals:  Vitals:   05/01/23 0914 05/01/23 1045  BP: (!) 143/107 (!) 129/94  Pulse: 68 81  Resp: 16 (!) 9  Temp: 36.5 C   SpO2: 100% 98%    Complications: No apparent anesthesia complications

## 2023-05-01 NOTE — Progress Notes (Signed)
Pt. was asked three times by nurse in pre-op about transportation pickup. Pt. stated that mom would pick her up and stay with her at home. Pt's mom attempted twice but no answer. Pt. attempted and did not receive an answer at that time. Dr. Valentino Saxon notified and pt. wanted to wait a bit longer to keep trying to call mom. After 30 minutes, pt. stated she had her mother on the phone and she would not be coming to pick her up and that she wanted to leave. Pt's IV removed. Pt. was educated about AMA form and agreeable, AMA form was signed. Dr. Valentino Saxon notified.

## 2023-05-01 NOTE — Discharge Instructions (Signed)
AMBULATORY SURGERY  ?DISCHARGE INSTRUCTIONS ? ? ?The drugs that you were given will stay in your system until tomorrow so for the next 24 hours you should not: ? ?Drive an automobile ?Make any legal decisions ?Drink any alcoholic beverage ? ? ?You may resume regular meals tomorrow.  Today it is better to start with liquids and gradually work up to solid foods. ? ?You may eat anything you prefer, but it is better to start with liquids, then soup and crackers, and gradually work up to solid foods. ? ? ?Please notify your doctor immediately if you have any unusual bleeding, trouble breathing, redness and pain at the surgery site, drainage, fever, or pain not relieved by medication. ? ? ? ?Additional Instructions: ? ? ? ?Please contact your physician with any problems or Same Day Surgery at [phone], Monday through Friday 6 am to 4 pm, or [place] [facility] number at [phone].  ?

## 2023-05-01 NOTE — H&P (Signed)
GYNECOLOGY PREOPERATIVE HISTORY AND PHYSICAL   Subjective:  Taylor Velazquez is a 27 y.o. [id number] here for surgical management of lost IUD threads.  Patient with IUD currently in place, has pelvic pain, desiring IUD removal. Notes that this happened the last time she had an IUD placed after her C-section and had to have IUD retrieval. Patient unable to tolerate IUD retrieval in the office. Significant preoperative concerns include recent diagnosis of BV and yeast, currently undergoing treatment.  Plans to utilize OCPs for contraception after IUD removal.   Proposed surgery: Hysteroscopic IUD retrieval for lost IUD threads    Pertinent Gynecological History: Menses: flow is light and usually lasting less than 6 days, irregular with IUD Contraception: IUD Last pap: normal Date: 04/18/2023.    Past Medical History:  Diagnosis Date   Abdominal adhesions    dense   Anemia    Gestational hypertension    Obesity    Past Surgical History:  Procedure Laterality Date   CESAREAN SECTION  2014   CESAREAN SECTION  2015   CESAREAN SECTION  2016   CESAREAN SECTION  2017   CESAREAN SECTION  2019   CESAREAN SECTION N/A 12/06/2022   Procedure: REPEAT CESAREAN SECTION;  Surgeon: Hildred Laser, MD;  Location: [facility] ORS;  Service: Obstetrics;  Laterality: N/A;    OB History  Gravida Para Term Preterm AB Living  6 6 5 1  0 7  SAB IAB Ectopic Multiple Live Births  0 0 0 1 7    # Outcome Date GA Lbr Len/2nd Weight Sex Delivery Anes PTL Lv  6 Preterm 12/06/22 [redacted]w[redacted]d  3320 g M CS-Classical Spinal  LIV  5 Term 08/22/18    M CS-LTranv   LIV  4 Term 03/30/16    M CS-LTranv   LIV  3 Term 01/01/15    M CS-LTranv   LIV  2A Term 01/03/14 [redacted]w[redacted]d   M CS-LTranv  N LIV  2B Term 01/03/14 [redacted]w[redacted]d   F CS-Unspec   LIV  1 Term 01/08/13    M CS-LTranv   LIV    Family History  Problem Relation Age of Onset   Hypertension Father    Asthma Father    Cancer Neg Hx    Diabetes Neg Hx    Heart disease Neg Hx     Stroke Neg Hx     Social History   Socioeconomic History   Marital status: Single    Spouse name: Not on file   Number of children: 5   Years of education: Not on file   Highest education level: Not on file  Occupational History   Not on file  Tobacco Use   Smoking status: Every Day    Packs/day: 0.50    Years: 1.00    Additional pack years: 0.00    Total pack years: 0.50    Types: Cigarettes   Smokeless tobacco: Never  Vaping Use   Vaping Use: Never used  Substance and Sexual Activity   Alcohol use: Never   Drug use: Never   Sexual activity: Yes    Birth control/protection: None    Comment: Pt reports wanting IUD after this pregnancy  Other Topics Concern   Not on file  Social History Narrative   Lives with children   Social Determinants of Health   Financial Resource Strain: Not on file  Food Insecurity: No Food Insecurity (12/06/2022)   Hunger Vital Sign    Worried About Radiation protection practitioner of Food  in the Last Year: Never true    Ran Out of Food in the Last Year: Never true  Transportation Needs: No Transportation Needs (12/06/2022)   PRAPARE - Administrator, Civil Service (Medical): No    Lack of Transportation (Non-Medical): No  Physical Activity: Not on file  Stress: Not on file  Social Connections: Not on file  Intimate Partner Violence: Not At Risk (12/06/2022)   Humiliation, Afraid, Rape, and Kick questionnaire    Fear of Current or Ex-Partner: No    Emotionally Abused: No    Physically Abused: No    Sexually Abused: No    No current facility-administered medications on file prior to encounter.   No current outpatient medications on file prior to encounter.    No Known Allergies    Review of Systems Constitutional: No recent fever/chills/sweats Respiratory: No recent cough/bronchitis Cardiovascular: No chest pain Gastrointestinal: No recent nausea/vomiting/diarrhea Genitourinary: No UTI symptoms Hematologic/lymphatic:No history of  coagulopathy or recent blood thinner use    Objective:   Blood pressure (!) 143/107, pulse 68, temperature 97.7 F (36.5 C), temperature source Temporal, resp. rate 16, height 5\' 9"  (1.753 m), weight 103 kg, last menstrual period 04/13/2023, SpO2 100 %, not currently breastfeeding. CONSTITUTIONAL: Well-developed, well-nourished female in no acute distress.  HENT:  Normocephalic, atraumatic, External right and left ear normal. Oropharynx is clear and moist EYES: Conjunctivae and EOM are normal. Pupils are equal, round, and reactive to light. No scleral icterus.  NECK: Normal range of motion, supple, no masses SKIN: Skin is warm and dry. No rash noted. Not diaphoretic. No erythema. No pallor. NEUROLOGIC: Alert and oriented to person, place, and time. Normal reflexes, muscle tone coordination. No cranial nerve deficit noted. PSYCHIATRIC: Normal mood and affect. Normal behavior. Normal judgment and thought content. CARDIOVASCULAR: Normal heart rate noted, regular rhythm RESPIRATORY: Effort and breath sounds normal, no problems with respiration noted ABDOMEN: Soft, nontender, nondistended. PELVIC: Deferred MUSCULOSKELETAL: Normal range of motion. No edema and no tenderness. 2+ distal pulses.    Labs: Results for orders placed or performed in visit on 04/18/23 (from the past 336 hour(s))  Cytology - PAP   Collection Time: 04/18/23  3:27 PM  Result Value Ref Range   Adequacy      Satisfactory for evaluation; transformation zone component ABSENT.   Diagnosis      - Negative for intraepithelial lesion or malignancy (NILM)   Microorganisms      Fungal organisms present consistent with Candida spp.  POCT Urinalysis Dipstick   Collection Time: 04/18/23  3:31 PM  Result Value Ref Range   Color, UA     Clarity, UA     Glucose, UA Negative Negative   Bilirubin, UA Negative    Ketones, UA Negative    Spec Grav, UA 1.020 1.010 - 1.025   Blood, UA Positive    pH, UA 6.0 5.0 - 8.0   Protein,  UA Positive (A) Negative   Urobilinogen, UA 0.2 0.2 or 1.0 E.U./dL   Nitrite, UA Negative    Leukocytes, UA Trace (A) Negative   Appearance     Odor    Urine Culture   Collection Time: 04/18/23  3:45 PM   Specimen: Urine   UR  Result Value Ref Range   Urine Culture, Routine Final report    Organism ID, Bacteria Comment   Cervicovaginal ancillary only   Collection Time: 04/18/23  3:56 PM  Result Value Ref Range   Neisseria Gonorrhea Negative  Chlamydia Negative    Trichomonas Negative    Bacterial Vaginitis (gardnerella) Positive (A)    Candida Vaginitis Positive (A)    Candida Glabrata Negative    Comment      Normal Reference Range Bacterial Vaginosis - Negative   Comment Normal Reference Range Candida Species - Negative    Comment Normal Reference Range Candida Galbrata - Negative    Comment Normal Reference Range Trichomonas - Negative    Comment Normal Reference Ranger Chlamydia - Negative    Comment      Normal Reference Range Neisseria Gonorrhea - Negative     Imaging Studies: No results found.  Assessment:    Lost IUD threads Pelvic pain   Plan:   - Counseling: Procedure, risks, reasons, benefits and complications (including uterine perforation,  injury to bowel, bladder, major blood vessel, bleeding, possibility of transfusion, infection, or fistula formation) reviewed in detail. Likelihood of success in alleviating the patient's condition was discussed. Routine postoperative instructions will be reviewed with the patient and her family in detail after surgery.  The patient concurred with the proposed plan, giving informed written consent for the surgery.   - Preop testing reviewed.  - Patient reports consumption of [facility] (several sips) at approximately 0530 this morning while at work. Discussed with Anesthesia, still ok to proceed.    Hildred Laser, MD [facility]

## 2023-05-01 NOTE — Anesthesia Preprocedure Evaluation (Signed)
Anesthesia Evaluation  Patient identified by MRN, date of birth, ID band Patient awake    Reviewed: Allergy & Precautions, NPO status , Patient's Chart, lab work & pertinent test results  History of Anesthesia Complications Negative for: history of anesthetic complications  Airway Mallampati: II  TM Distance: >3 FB Neck ROM: Full    Dental no notable dental hx. (+) Teeth Intact   Pulmonary neg sleep apnea, neg COPD, Current SmokerPatient did not abstain from smoking. Smokes marijuana. No tobacco   Pulmonary exam normal breath sounds clear to auscultation       Cardiovascular Exercise Tolerance: Good METShypertension, Pt. on medications (-) CAD and (-) Past MI (-) dysrhythmias  Rhythm:Regular Rate:Normal - Systolic murmurs    Neuro/Psych negative neurological ROS  negative psych ROS   GI/Hepatic ,neg GERD  ,,(+)     substance abuse  marijuana use  Endo/Other  neg diabetes    Renal/GU negative Renal ROS     Musculoskeletal   Abdominal  (+) + obese  Peds  Hematology   Anesthesia Other Findings Past Medical History: No date: Abdominal adhesions     Comment:  dense No date: Anemia No date: Gestational hypertension No date: Obesity  Reproductive/Obstetrics                             Anesthesia Physical Anesthesia Plan  ASA: 2  Anesthesia Plan: General   Post-op Pain Management: Minimal or no pain anticipated, Tylenol PO (pre-op)*, Gabapentin PO (pre-op)* and Celebrex PO (pre-op)*   Induction: Intravenous  PONV Risk Score and Plan: 3 and Propofol infusion, TIVA, Ondansetron and Midazolam  Airway Management Planned: Nasal Cannula  Additional Equipment: None  Intra-op Plan:   Post-operative Plan:   Informed Consent: I have reviewed the patients History and Physical, chart, labs and discussed the procedure including the risks, benefits and alternatives for the proposed  anesthesia with the patient or authorized representative who has indicated his/her understanding and acceptance.     Dental advisory given  Plan Discussed with: CRNA and Surgeon  Anesthesia Plan Comments: (Discussed risks of anesthesia with patient, including possibility of difficulty with spontaneous ventilation under anesthesia necessitating airway intervention, PONV, and rare risks such as cardiac or respiratory or neurological events, and allergic reactions. Discussed the role of CRNA in patient's perioperative care. Patient understands.)       Anesthesia Quick Evaluation

## 2023-05-02 ENCOUNTER — Encounter: Payer: Self-pay | Admitting: Obstetrics and Gynecology

## 2023-05-02 ENCOUNTER — Other Ambulatory Visit: Payer: Self-pay

## 2023-05-08 ENCOUNTER — Telehealth: Payer: Self-pay

## 2023-05-08 NOTE — Telephone Encounter (Signed)
Tried to call patient no answer, no vm. Sent her a Clinical cytogeneticist message about her bleeding.

## 2023-05-08 NOTE — Telephone Encounter (Signed)
It is more than likely her menstrual cycle now that the IUD is out. Sometimes it can be heavier.  Has she started on the new birth control pills yet? If note then this will help her bleeding.

## 2023-05-08 NOTE — Telephone Encounter (Signed)
Called patient to check up on her because she called the nurse advice line over the weekend with concerns of heavy bleeding after having IUD surgically removed. She stated that it is worse than a menstrual cycle. Please advise.

## 2023-05-09 ENCOUNTER — Other Ambulatory Visit: Payer: Self-pay

## 2023-05-09 ENCOUNTER — Emergency Department
Admission: EM | Admit: 2023-05-09 | Discharge: 2023-05-09 | Disposition: A | Discharge status: Home / Self Care | Payer: Medicaid Other | Attending: Emergency Medicine | Admitting: Emergency Medicine

## 2023-05-09 DIAGNOSIS — S0121XA Laceration without foreign body of nose, initial encounter: Secondary | ICD-10-CM | POA: Insufficient documentation

## 2023-05-09 DIAGNOSIS — W228XXA Striking against or struck by other objects, initial encounter: Secondary | ICD-10-CM | POA: Diagnosis not present

## 2023-05-09 DIAGNOSIS — S0181XA Laceration without foreign body of other part of head, initial encounter: Secondary | ICD-10-CM | POA: Diagnosis not present

## 2023-05-09 DIAGNOSIS — S0992XA Unspecified injury of nose, initial encounter: Secondary | ICD-10-CM | POA: Diagnosis present

## 2023-05-09 NOTE — Discharge Instructions (Signed)
Keep the wound completely dry for at least 24 hours.  Do not scratch or pick at the Dermabond.  Do not use Vaseline, antibiotic ointment, or face creams in that area until the glue is gone and the wound is healed.  Afterwards, you may use a scar cream such as Maderma.

## 2023-05-09 NOTE — ED Triage Notes (Signed)
Pt presents to ER with c/o nose injury.  Pt states today around 1000 she hit her nose.  Pt has appx 1.5 cm lac to top left bridge of her nose with bleeding controlled at this time.  Pt is otherwise A&O x4 and in NAD in triage.   Pt reports she is UTD on her TDAP.

## 2023-05-09 NOTE — ED Provider Notes (Signed)
[facility] Provider Note    Event Date/Time   First MD Initiated Contact with Patient 05/09/23 2113     (approximate)   History   Facial Injury   HPI  Taylor Velazquez is a 27 y.o. female with history of anemia and as listed in EMR presents to the emergency department for treatment and evaluation of laceration to the top left bridge of her nose.  She states that she bent over to pick something up and hit her face on the corner of a metal table.  Bleeding is well-controlled at this time.  Tdap booster is current..      Physical Exam   Triage Vital Signs: ED Triage Vitals  Enc Vitals Group     BP 05/09/23 2015 (!) 134/95     Pulse Rate 05/09/23 2015 80     Resp 05/09/23 2015 17     Temp 05/09/23 2015 98.4 F (36.9 C)     Temp Source 05/09/23 2015 Oral     SpO2 05/09/23 2015 98 %     Weight --      Height --      Head Circumference --      Peak Flow --      Pain Score 05/09/23 2018 4     Pain Loc --      Pain Edu? --      Excl. in GC? --     Most recent vital signs: Vitals:   05/09/23 2015 05/09/23 2137  BP: (!) 134/95 133/85  Pulse: 80 82  Resp: 17   Temp: 98.4 F (36.9 C) 98.3 F (36.8 C)  SpO2: 98% 98%    General: Awake, no distress.  CV:  Good peripheral perfusion.  Resp:  Normal effort.  Abd:  No distention.  Other:  1 cm laceration to the superior aspect of the nasal bridge toward the left inner canthus.   ED Results / Procedures / Treatments   Labs (all labs ordered are listed, but only abnormal results are displayed) Labs Reviewed - No data to display   EKG  Not indicated   RADIOLOGY  Image and radiology report reviewed and interpreted by me. Radiology report consistent with the same.  Not indicated  PROCEDURES:  Critical Care performed: No  ..Laceration Repair  Date/Time: 05/09/2023 10:30 PM  Performed by: Chinita Pester, FNP Authorized by: Chinita Pester, FNP   Consent:    Risks discussed:   Poor cosmetic result Universal protocol:    Patient identity confirmed:  Verbally with patient Laceration details:    Location:  Face   Face location:  Nose   Length (cm):  1 Treatment:    Area cleansed with:  Chlorhexidine and saline   Irrigation method:  Tap Skin repair:    Repair method:  Tissue adhesive Approximation:    Approximation:  Close Repair type:    Repair type:  Simple Post-procedure details:    Dressing:  Open (no dressing)   Procedure completion:  Tolerated well, no immediate complications    MEDICATIONS ORDERED IN ED:  Medications - No data to display   IMPRESSION / MDM / ASSESSMENT AND PLAN / ED COURSE   I have reviewed the triage note.  Differential diagnosis includes, but is not limited to, abrasion, laceration  Patient's presentation is most consistent with acute illness / injury with system symptoms.  27 year old female presenting to the emergency department for treatment and evaluation of laceration to the left upper bridge of  her nose.  See HPI for further details.  Wound was cleaned with chlorhexidine and normal saline and repaired as described above.  Wound care instructions were discussed with the patient and she was discharged home.       FINAL CLINICAL IMPRESSION(S) / ED DIAGNOSES   Final diagnoses:  Facial laceration, initial encounter     Rx / DC Orders   ED Discharge Orders     None        Note:  This document was prepared using Dragon voice recognition software and may include unintentional dictation errors.   Chinita Pester, FNP 05/09/23 2231    Jene Every, MD 05/09/23 [phone]

## 2023-05-16 NOTE — Progress Notes (Deleted)
    OBSTETRICS/GYNECOLOGY POST-OPERATIVE CLINIC VISIT  Subjective:     Taylor Velazquez is a 26 y.o. female who presents to the clinic 2 weeks status post Hysteroscopic IUD retrieval  for lost IUD threads . Eating a regular diet {with-without:5700} difficulty. Bowel movements are {normal/abnormal***:19619}. {pain control:13522::"The patient is not having any pain."}  {Common ambulatory SmartLinks:19316}  Review of Systems {ros; complete:30496}   Objective:   LMP 04/13/2023 (Approximate)  There is no height or weight on file to calculate BMI.  General:  alert and no distress  Abdomen: soft, bowel sounds active, non-tender  Incision:   {incision:13716::"no dehiscence","incision well approximated","healing well","no drainage","no erythema","no hernia","no seroma","no swelling"}    Pathology:    Assessment:   Patient s/p *** (surgery)  {doing well:13525::"Doing well postoperatively."}   Plan:   1. Continue any current medications as instructed by provider. 2. Wound care discussed. 3. Operative findings again reviewed. Pathology report discussed. 4. Activity restrictions: {restrictions:13723} 5. Anticipated return to work: {work return:14002}. 6. Follow up: {1-61:09604} {time; units:18646} for ***    Hildred Laser, MD [facility]

## 2023-05-17 ENCOUNTER — Encounter: Payer: Medicaid Other | Admitting: Obstetrics and Gynecology

## 2023-05-22 ENCOUNTER — Ambulatory Visit: Payer: Medicaid Other

## 2023-10-06 IMAGING — US US OB < 14 WEEKS - US OB TV
1 series · 15 of 28 positions shown · non-contrast
Comparison: None Available.

CLINICAL DATA: Pain.

EXAM:
OBSTETRIC <14 WK US AND TRANSVAGINAL OB US
TECHNIQUE: Both transabdominal and transvaginal ultrasound examinations were
performed for complete evaluation of the gestation as well as the
maternal uterus, adnexal regions, and pelvic cul-de-sac.
Transvaginal technique was performed to assess early pregnancy.

[Series 1: us ob < 14 weeks - us ob tv · 61 acquisitions, 15 frames shown]
[im 1/61]
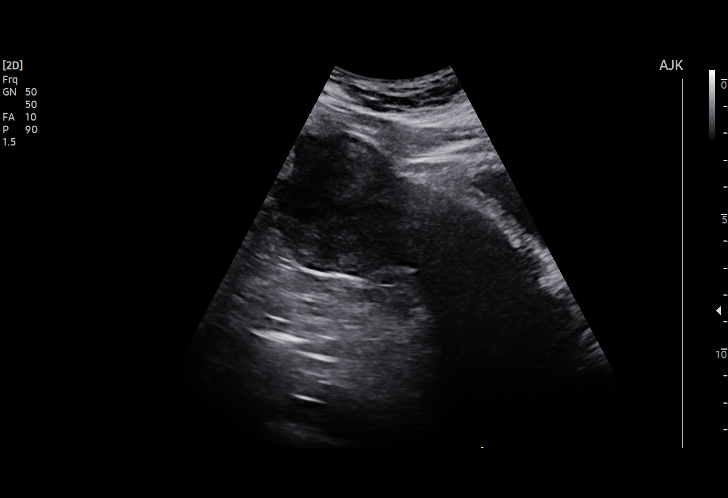
[im 5/61]
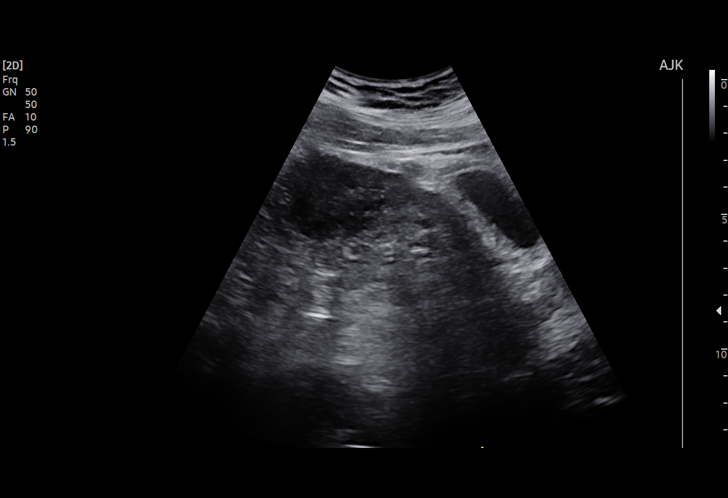
[im 9/61]
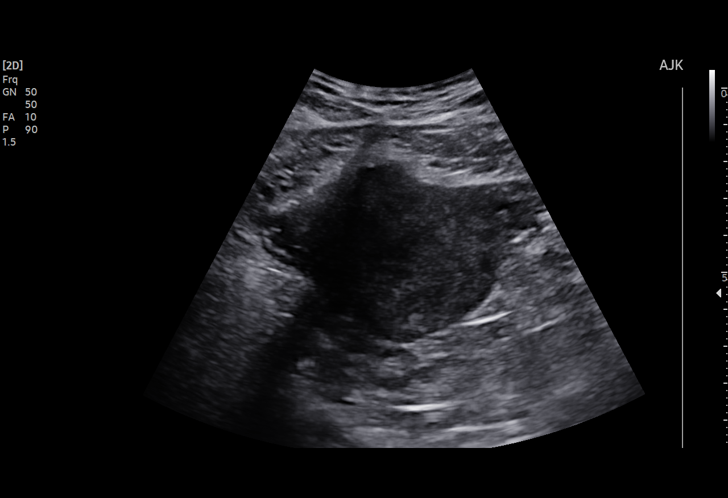
[im 14/61]
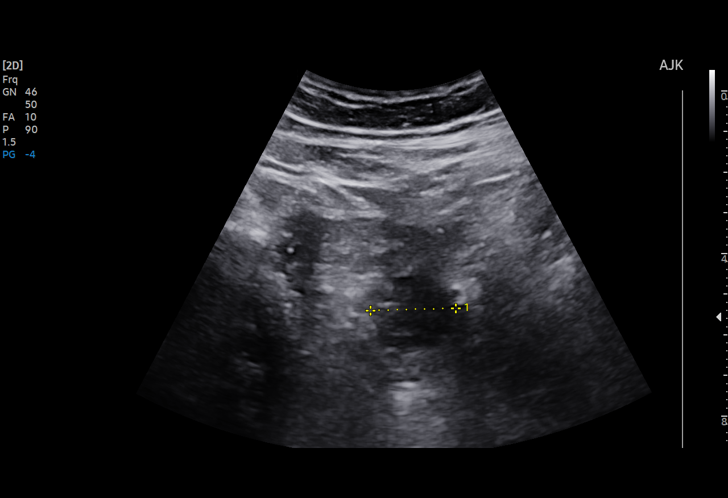
[im 18/61]
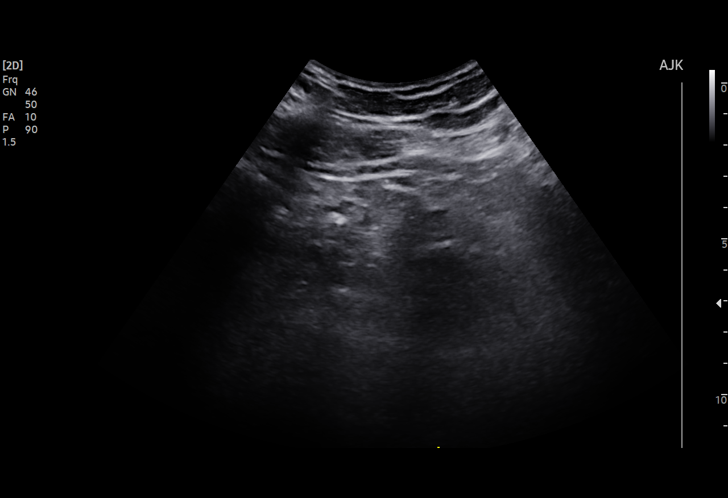
[im 23/61]
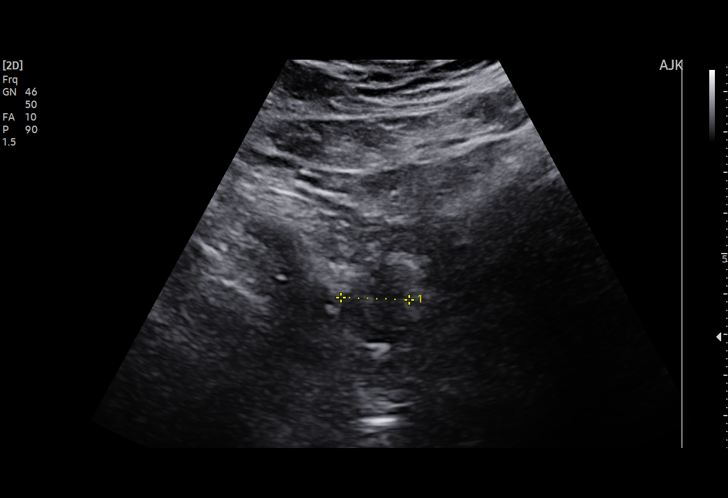
[im 27/61]
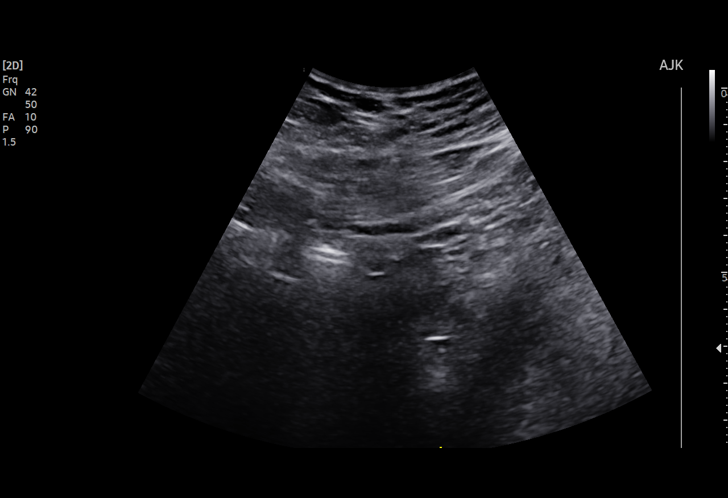
[im 32/61]
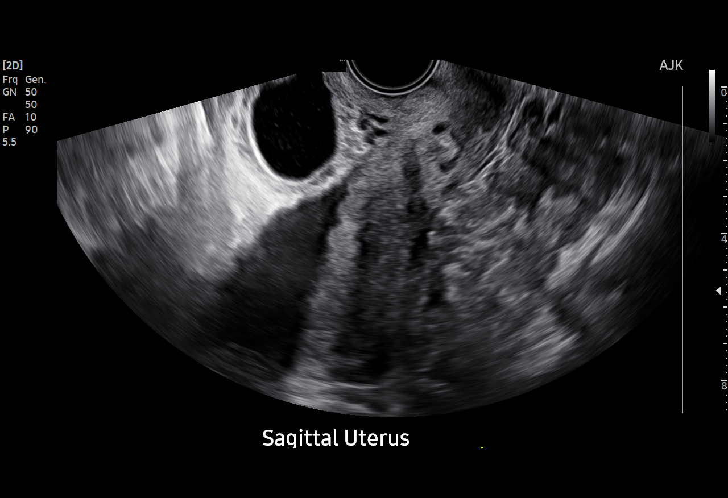
[im 34/61]
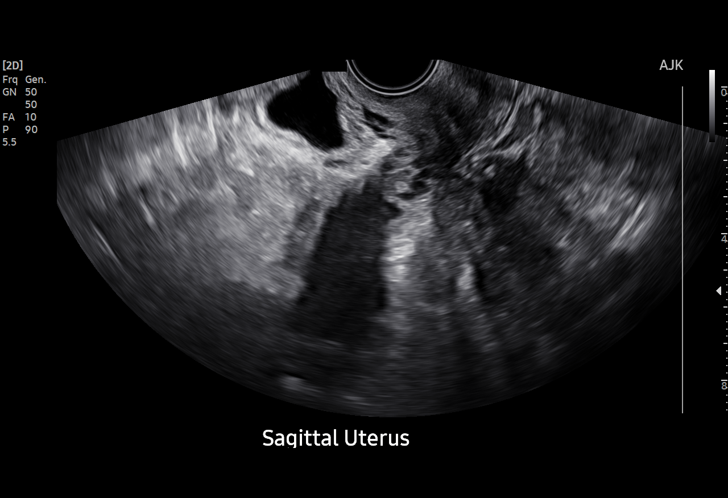
[im 38/61]
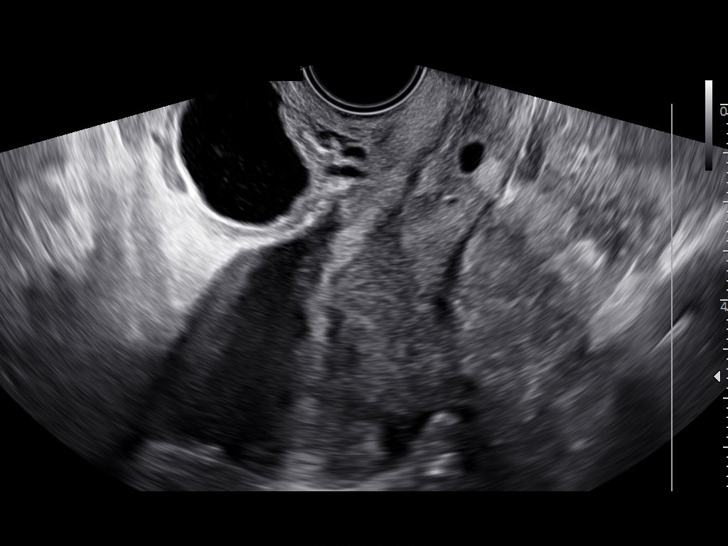
[im 43/61]
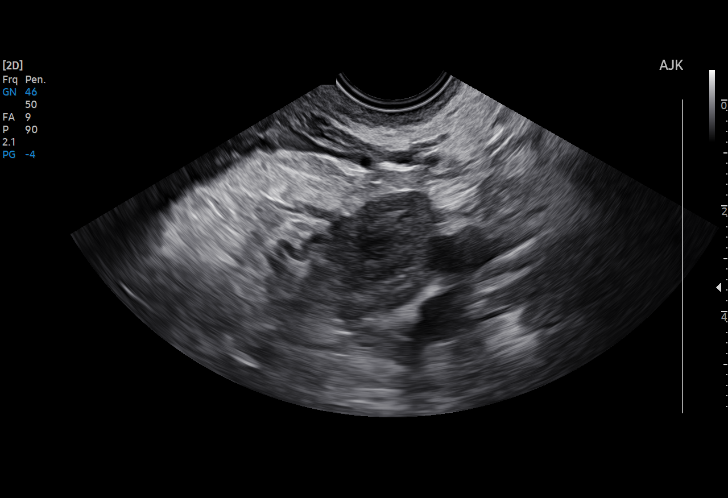
[im 47/61]
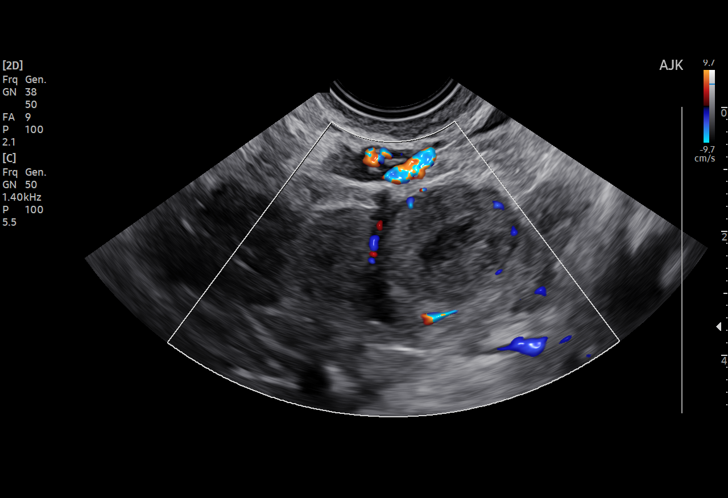
[im 52/61]
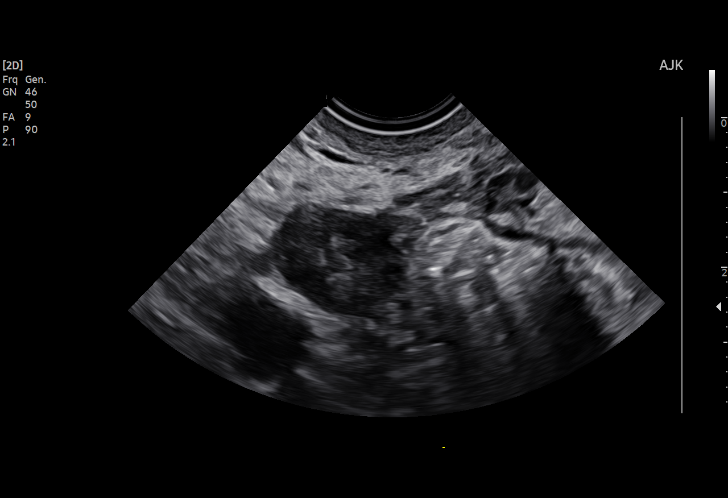
[im 56/61]
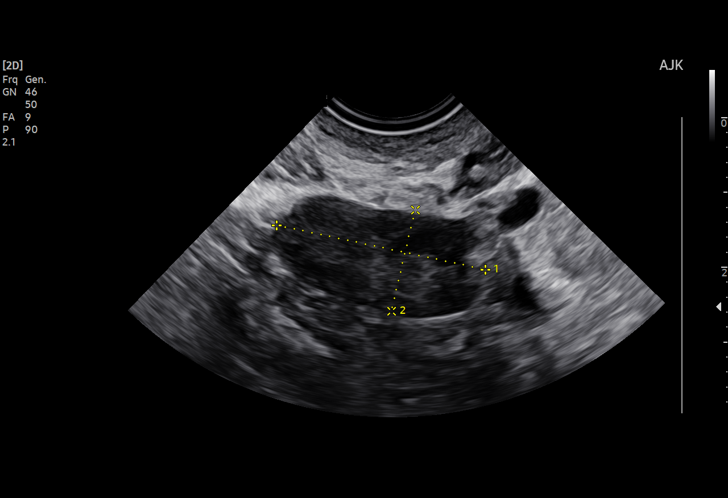
[im 61/61]
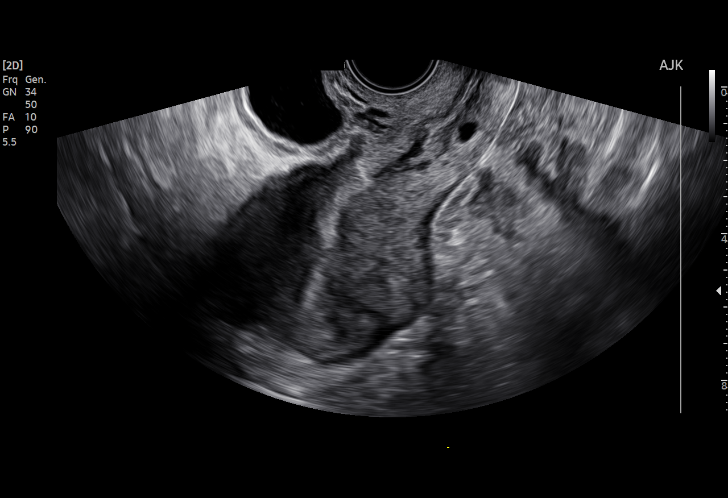

[15 of 28 positions shown; findings below may reference images not displayed]

FINDINGS: Intrauterine gestational sac: None

Endometrium: Not well evaluated or visualized secondary to artifact.

Right ovary: Within normal limits measuring 2.0 by 2.8 x 1.4 cm.

Left ovary: Measures 3.7 x 2.2 by 2.0 cm. Likely corpus luteum noted
in the left ovary measuring 2.1 x 2.2 by 1.7 cm.

Other: Trace free fluid in the pelvis.
IMPRESSION: 1. No intrauterine gestational sac identified. Endometrium is not
well visualized on this study. No adnexal masses. Likely corpus
luteum in the left ovary. In the setting of a positive pregnancy
test, findings may be related to early normal IUP, completed
abortion/abortion in progress or occult ectopic pregnancy. Recommend
correlation with serial beta HCG.

## 2023-10-18 IMAGING — US US OB TRANSVAGINAL
1 series · 15 of 28 positions shown · non-contrast
Comparison: 04/29/2022 exam with no IUP visible.

CLINICAL DATA: Pregnant with abdominal pain and spotting since last
night.

EXAM:
TRANSVAGINAL OB ULTRASOUND
TECHNIQUE: Transvaginal ultrasound was performed for complete evaluation of the
gestation as well as the maternal uterus, adnexal regions, and
pelvic cul-de-sac.

[Series 1: us ob transvaginal · 15 of 48 slices shown]
[im 1/48]
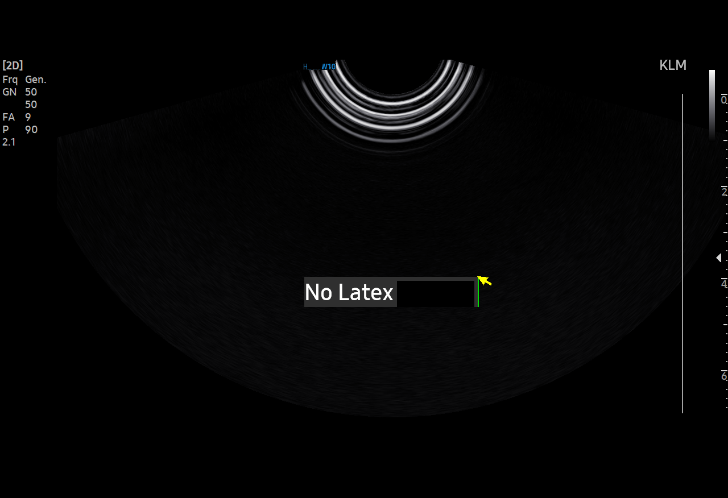
[im 4/48]
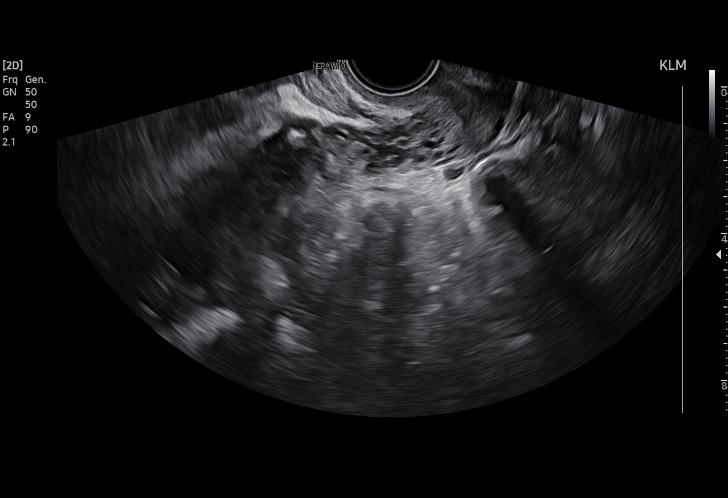
[im 7/48]
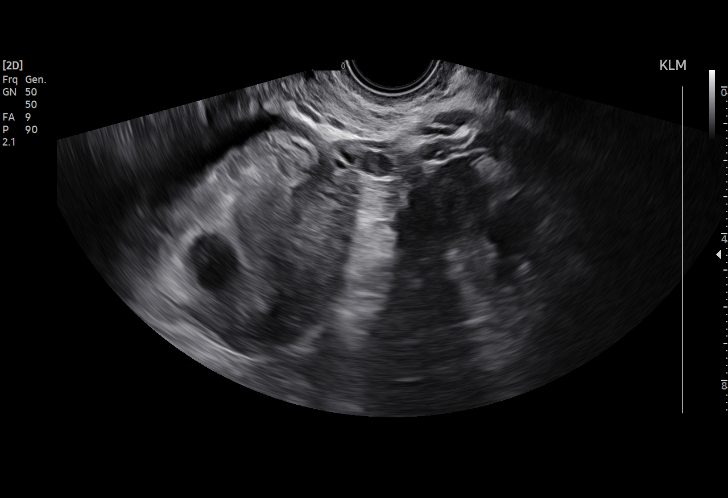
[im 11/48]
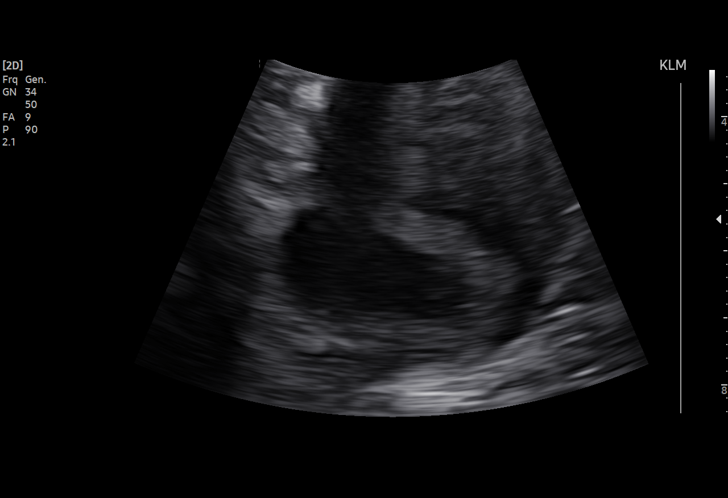
[im 14/48]
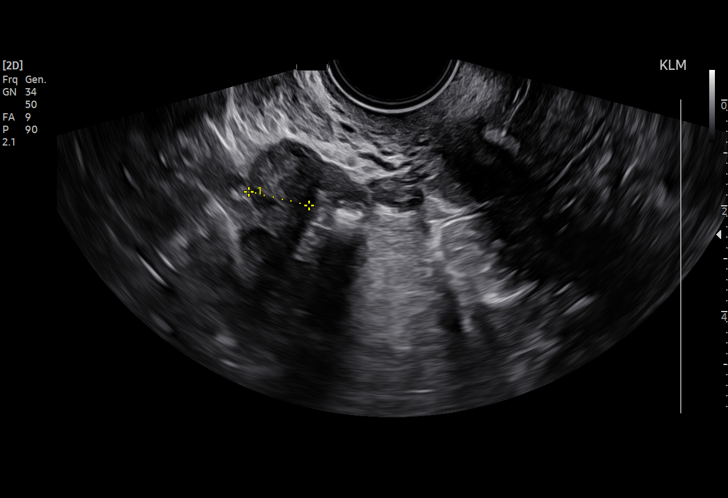
[im 18/48]
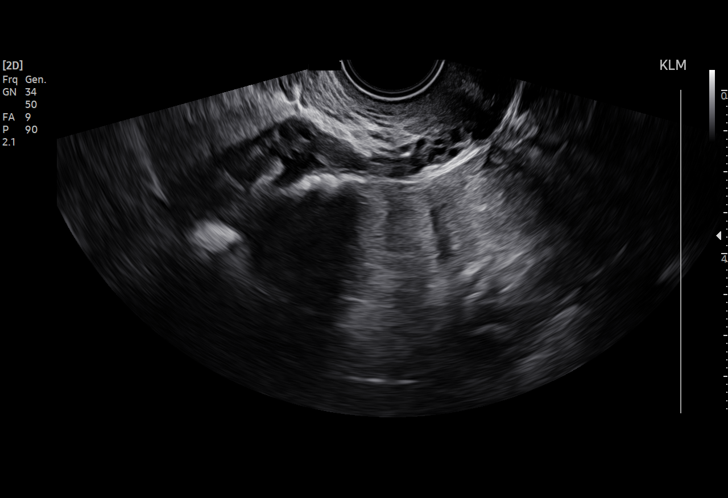
[im 21/48]
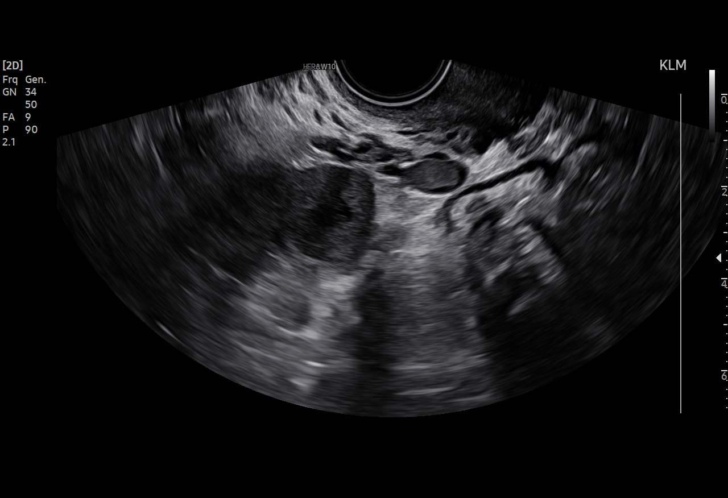
[im 25/48]
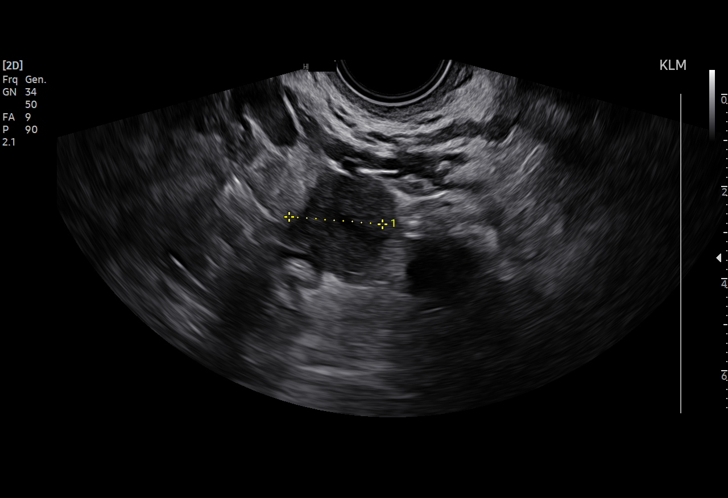
[im 27/48]
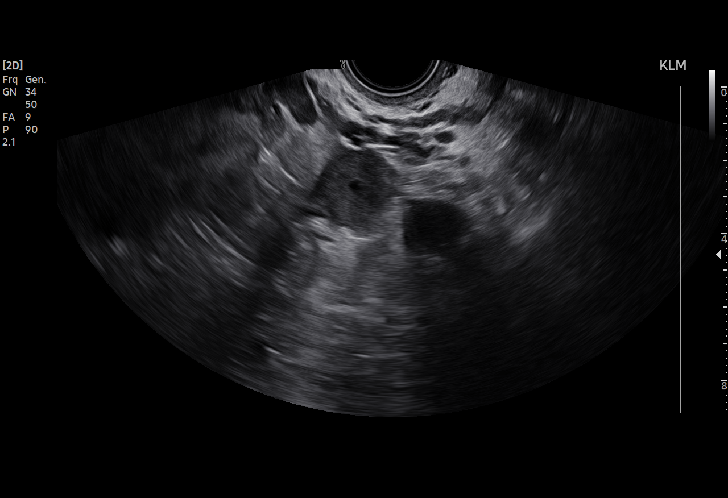
[im 30/48]
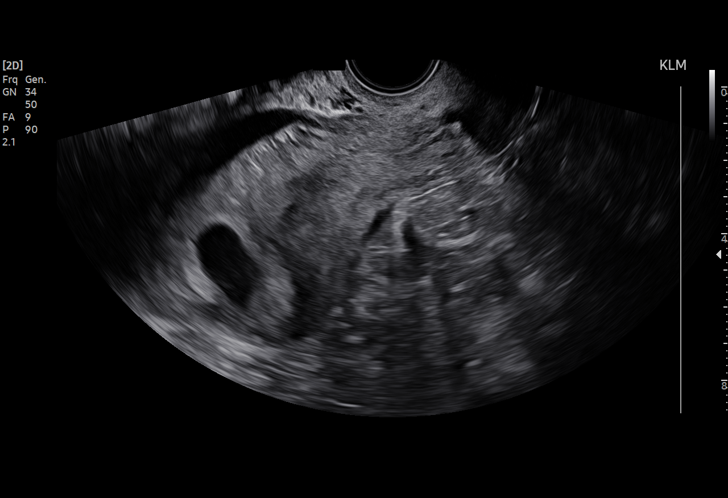
[im 34/48]
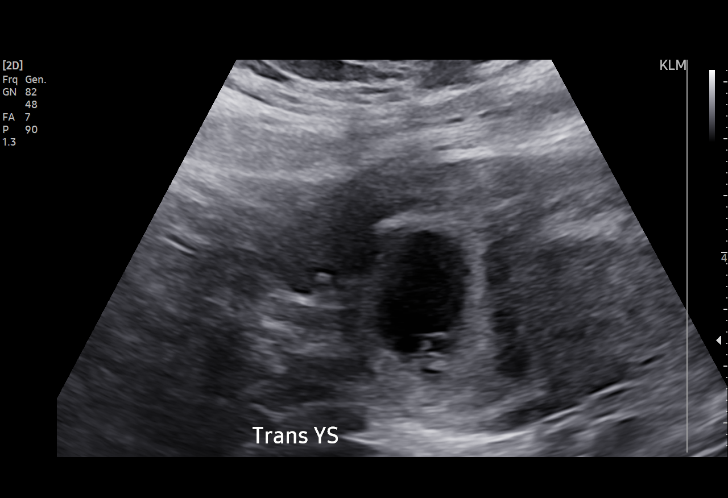
[im 37/48]
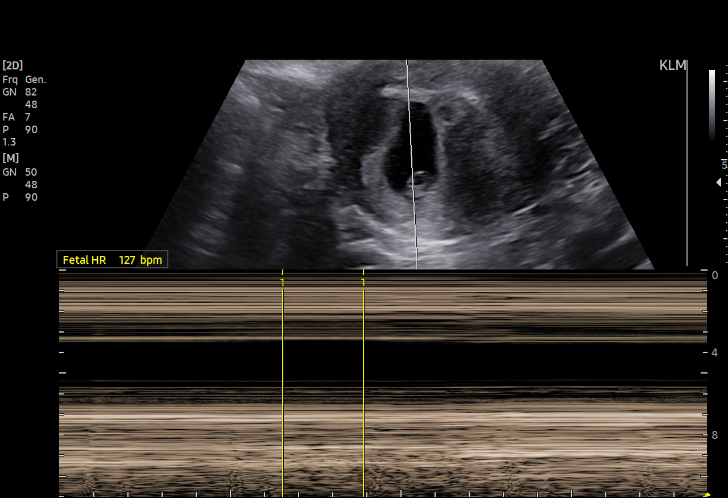
[im 41/48]
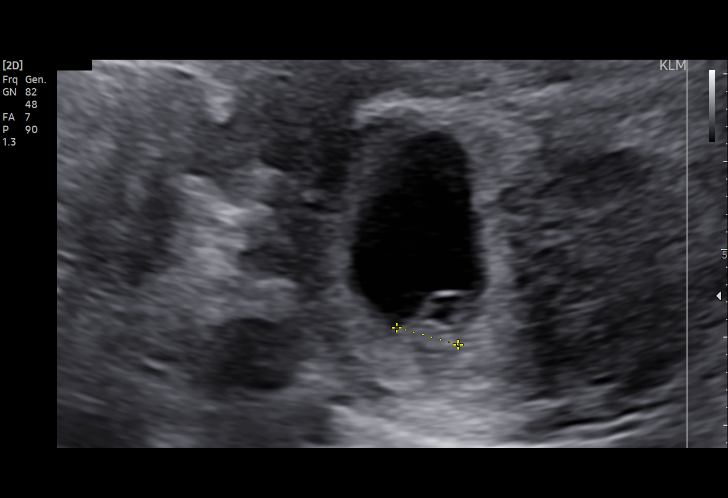
[im 44/48]
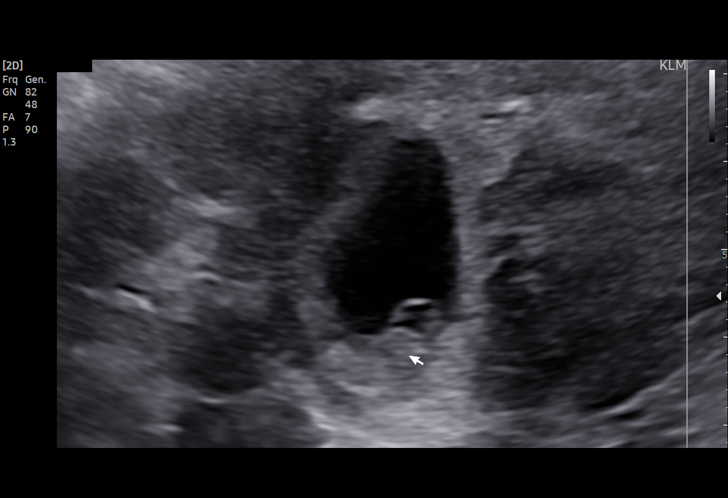
[im 48/48]
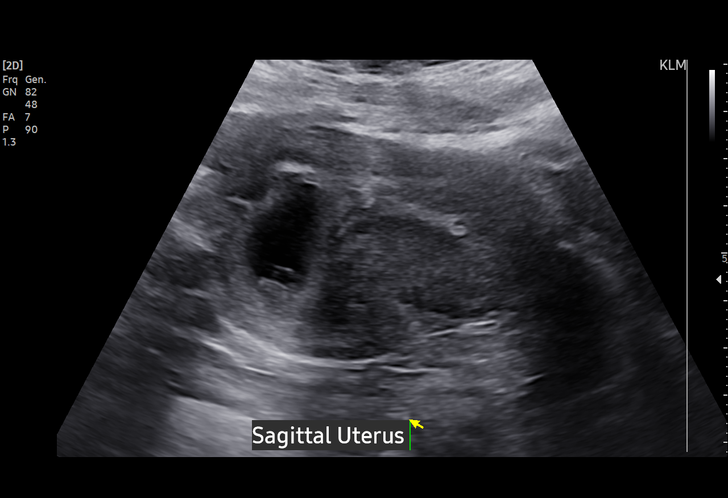

[15 of 28 positions shown; findings below may reference images not displayed]

FINDINGS: Intrauterine gestational sac: Single.

Yolk sac:  Visualized.

Embryo:  Visualized.

Cardiac Activity: Visualized.

Heart Rate: 127 bpm

MSD: Fetal pole visible.  Not measured.

CRL:   7.6 mm   6 w 5 d +/-4 days; US EDC: 12/30/2022

Subchorionic hemorrhage:  None visualized.

Maternal uterus/adnexae: The ovaries are sonographically
unremarkable and normal in size. No adnexal free fluid or mass is
seen.

The uterus is anteverted and slightly retroflexed. The cervix is
closed, measures 4.1 cm in length and there are a few tiny nabothian
cysts. No uterine wall mass is seen.
IMPRESSION: 1. Living single IUP measuring 6 weeks 5 days, ultrasound EDC
12/30/2022.
2. No subchorionic hemorrhage or other acute sonographic findings.

## 2023-11-19 ENCOUNTER — Emergency Department
Admission: EM | Admit: 2023-11-19 | Discharge: 2023-11-19 | Disposition: A | Discharge status: Home / Self Care | Payer: Medicaid Other | Attending: Emergency Medicine | Admitting: Emergency Medicine

## 2023-11-19 ENCOUNTER — Emergency Department: Payer: Medicaid Other

## 2023-11-19 ENCOUNTER — Other Ambulatory Visit: Payer: Self-pay

## 2023-11-19 DIAGNOSIS — M545 Low back pain, unspecified: Secondary | ICD-10-CM | POA: Diagnosis present

## 2023-11-19 DIAGNOSIS — R109 Unspecified abdominal pain: Secondary | ICD-10-CM | POA: Diagnosis not present

## 2023-11-19 LAB — URINALYSIS, ROUTINE W REFLEX MICROSCOPIC
Bilirubin Urine: NEGATIVE
Glucose, UA: NEGATIVE mg/dL
Hgb urine dipstick: NEGATIVE
Ketones, ur: NEGATIVE mg/dL
Leukocytes,Ua: NEGATIVE
Nitrite: NEGATIVE
Protein, ur: NEGATIVE mg/dL
Specific Gravity, Urine: 1.025 (ref 1.005–1.030)
pH: 6 (ref 5.0–8.0)

## 2023-11-19 LAB — CBC WITH DIFFERENTIAL/PLATELET
Abs Immature Granulocytes: 0.03 10*3/uL (ref 0.00–0.07)
Basophils Absolute: 0 10*3/uL (ref 0.0–0.1)
Basophils Relative: 0 %
Eosinophils Absolute: 0.1 10*3/uL (ref 0.0–0.5)
Eosinophils Relative: 1 %
HCT: 37.5 % (ref 36.0–46.0)
Hemoglobin: 11.8 g/dL — ABNORMAL LOW (ref 12.0–15.0)
Immature Granulocytes: 0 %
Lymphocytes Relative: 28 %
Lymphs Abs: 3 10*3/uL (ref 0.7–4.0)
MCH: 26.7 pg (ref 26.0–34.0)
MCHC: 31.5 g/dL (ref 30.0–36.0)
MCV: 84.8 fL (ref 80.0–100.0)
Monocytes Absolute: 0.5 10*3/uL (ref 0.1–1.0)
Monocytes Relative: 5 %
Neutro Abs: 6.9 10*3/uL (ref 1.7–7.7)
Neutrophils Relative %: 66 %
Platelets: 171 10*3/uL (ref 150–400)
RBC: 4.42 MIL/uL (ref 3.87–5.11)
RDW: 14.1 % (ref 11.5–15.5)
WBC: 10.6 10*3/uL — ABNORMAL HIGH (ref 4.0–10.5)
nRBC: 0 % (ref 0.0–0.2)

## 2023-11-19 LAB — COMPREHENSIVE METABOLIC PANEL
ALT: 18 U/L (ref 0–44)
AST: 21 U/L (ref 15–41)
Albumin: 3.7 g/dL (ref 3.5–5.0)
Alkaline Phosphatase: 56 U/L (ref 38–126)
Anion gap: 8 (ref 5–15)
BUN: 10 mg/dL (ref 6–20)
CO2: 27 mmol/L (ref 22–32)
Calcium: 9 mg/dL (ref 8.9–10.3)
Chloride: 102 mmol/L (ref 98–111)
Creatinine, Ser: 0.68 mg/dL (ref 0.44–1.00)
GFR, Estimated: >60 mL/min (ref >60)
Glucose, Bld: 91 mg/dL (ref 70–99)
Potassium: 3.7 mmol/L (ref 3.5–5.1)
Sodium: 137 mmol/L (ref 135–145)
Total Bilirubin: 0.6 mg/dL (ref <1.2)
Total Protein: 7.6 g/dL (ref 6.5–8.1)

## 2023-11-19 LAB — POC URINE PREG, ED: Preg Test, Ur: NEGATIVE

## 2023-11-19 MED ORDER — CYCLOBENZAPRINE HCL 10 MG PO TABS
10.0000 mg | ORAL_TABLET | Freq: Once | ORAL | Status: AC
  Administered 2023-11-19: 10 mg via ORAL
  Filled 2023-11-19: qty 1

## 2023-11-19 MED ORDER — CYCLOBENZAPRINE HCL 5 MG PO TABS: 5.0000 mg | ORAL_TABLET | Freq: Three times a day (TID) | ORAL | 0 refills | Status: AC | PRN

## 2023-11-19 NOTE — ED Triage Notes (Signed)
Pt BIB EMS with c/o lower left back pain that started 2 days ago. Pt denies urinary symptoms or fevers.

## 2023-11-19 NOTE — ED Provider Notes (Signed)
[facility] Emergency Department Provider Note     Event Date/Time   First MD Initiated Contact with Patient 11/19/23 1611     (approximate)   History   Back Pain   HPI  Taylor Velazquez is a 27 y.o. female with a history of obesity, anemia, and gestational hypertension who presents to the ED with low back pain.  She presents to the ED via EMS from home, for evaluation of continued left-sided flank spasms.  Patient with onset 2 days prior, denies any urinary symptoms, fevers, chills, or sweats.  She denies any recent injury, trauma, or falls.  No bladder or bowel incontinence, foot drop, or saddle anesthesia reported.   Physical Exam   Triage Vital Signs: ED Triage Vitals  Encounter Vitals Group     BP 11/19/23 1536 138/89     Systolic BP Percentile --      Diastolic BP Percentile --      Pulse Rate 11/19/23 1536 82     Resp 11/19/23 1536 16     Temp 11/19/23 1536 99.5 F (37.5 C)     Temp Source 11/19/23 1536 Oral     SpO2 11/19/23 1536 94 %     Weight 11/19/23 1533 234 lb (106.1 kg)     Height --      Head Circumference --      Peak Flow --      Pain Score 11/19/23 1533 0     Pain Loc --      Pain Education --      Exclude from Growth Chart --     Most recent vital signs: Vitals:   11/19/23 1536  BP: 138/89  Pulse: 82  Resp: 16  Temp: 99.5 F (37.5 C)  SpO2: 94%    General Awake, no distress. NAD HEENT NCAT. PERRL. EOMI. No rhinorrhea. Mucous membranes are moist.  CV:  Good peripheral perfusion. RRR RESP:  Normal effort. CTA ABD:  No distention.  No CVA tenderness elicited.  Patient verbalized tenderness to the right and left lateral flank region of the inferior rib border.  No skin changes appreciated.  No rash noted. MSK:  Normal spinal alignment without midline tenderness, spasm, deformity, or step-off.  Normal active range of motion of the upper and lower extremities bilaterally.  No obvious deformity or dislocation  appreciated.   ED Results / Procedures / Treatments   Labs (all labs ordered are listed, but only abnormal results are displayed) Labs Reviewed  CBC WITH DIFFERENTIAL/PLATELET - Abnormal; Notable for the following components:      Result Value   WBC 10.6 (*)    Hemoglobin 11.8 (*)    All other components within normal limits  URINALYSIS, ROUTINE W REFLEX MICROSCOPIC - Abnormal; Notable for the following components:   Color, Urine YELLOW (*)    APPearance HAZY (*)    All other components within normal limits  COMPREHENSIVE METABOLIC PANEL  POC URINE PREG, ED     EKG   RADIOLOGY  I personally viewed and evaluated these images as part of my medical decision making, as well as reviewing the written report by the radiologist.  ED Provider Interpretation: No acute findings  CT Renal Stone Study Result Date: 11/19/2023 CLINICAL DATA:  Left lower back pain for 2 days. EXAM: CT ABDOMEN AND PELVIS WITHOUT CONTRAST TECHNIQUE: Multidetector CT imaging of the abdomen and pelvis was performed following the standard protocol without IV contrast. RADIATION DOSE REDUCTION: This exam was performed  according to the departmental dose-optimization program which includes automated exposure control, adjustment of the mA and/or kV according to patient size and/or use of iterative reconstruction technique. COMPARISON:  Pelvic ultrasound dated 11/08/2022. FINDINGS: Lower chest: No acute abnormality. Hepatobiliary: No focal liver abnormality is seen. No gallstones, gallbladder wall thickening, or biliary dilatation. Pancreas: Unremarkable. No pancreatic ductal dilatation or surrounding inflammatory changes. Spleen: Normal in size without focal abnormality. Adrenals/Urinary Tract: Adrenal glands are unremarkable. Kidneys are normal, without renal calculi, focal lesion, or hydronephrosis. Bladder is unremarkable. Stomach/Bowel: Stomach is within normal limits. Appendix appears normal, but is in close proximity  to fluid within the pelvis. This is favored to reflect a normal appendix and physiologic pelvic fluid. No evidence of bowel wall thickening, distention, or inflammatory changes. Vascular/Lymphatic: No significant vascular findings are present. No enlarged abdominal or pelvic lymph nodes. Reproductive: Uterus and bilateral adnexa are unremarkable. Other: There is a fat containing ventral abdominal wall hernia located superior to the umbilicus. Trace fluid is seen in the pelvis. Musculoskeletal: No acute or significant osseous findings. IMPRESSION: 1. No acute findings in the abdomen or pelvis. 2. Fat containing ventral abdominal wall hernia located superior to the umbilicus. Electronically Signed   By: Romona Curls M.D.   On: 11/19/2023 17:06     PROCEDURES:  Critical Care performed: No  Procedures   MEDICATIONS ORDERED IN ED: Medications  cyclobenzaprine (FLEXERIL) tablet 10 mg (10 mg Oral Given 11/19/23 1745)     IMPRESSION / MDM / ASSESSMENT AND PLAN / ED COURSE  I reviewed the triage vital signs and the nursing notes.                              Differential diagnosis includes, but is not limited to, ovarian cyst, ovarian torsion, acute appendicitis, diverticulitis, urinary tract infection/pyelonephritis, endometriosis, bowel obstruction, colitis, renal colic, gastroenteritis, hernia, fibroids, endometriosis, pregnancy related pain including ectopic pregnancy, etc.  Patient's presentation is most consistent with acute, uncomplicated illness.  Patient's diagnosis is consistent with left flank pain likely musculoskeletal in nature.  With reassuring exam and workup at this time.  No acute lab abnormalities noted.  No evidence of cystitis based on UA.  CT scan is reassuring as it shows no acute nephrolithiasis or pyelonephritis.  No skin changes overlying the area of concern to indicate shingles.  No recent trauma so low suspicion of musculoskeletal arthropathy.  Patient will be discharged  home with prescriptions for cyclobenzaprine. Patient is to follow up with her PCP or local urgent care as discussed, as needed or otherwise directed. Patient is given ED precautions to return to the ED for any worsening or new symptoms.  FINAL CLINICAL IMPRESSION(S) / ED DIAGNOSES   Final diagnoses:  Acute left flank pain     Rx / DC Orders   ED Discharge Orders          Ordered    cyclobenzaprine (FLEXERIL) 5 MG tablet  3 times daily PRN        11/19/23 1740             Note:  This document was prepared using Dragon voice recognition software and may include unintentional dictation errors.    Lissa Hoard, PA-C 11/19/23 1759    Dionne Bucy, MD 11/19/23 [phone]

## 2023-11-19 NOTE — Discharge Instructions (Signed)
Your exam, labs, and CT scan are normal and reassuring at this time.  No signs of a kidney stone or urinary tract infection.  Your symptoms may be due to a musculoskeletal strain.  Take prescription muscle relaxant as directed.  Follow-up with your primary provider or local urgent care for ongoing concerns.

## 2024-06-13 ENCOUNTER — Other Ambulatory Visit: Payer: Self-pay

## 2024-06-13 ENCOUNTER — Encounter: Payer: Self-pay | Admitting: Emergency Medicine

## 2024-06-13 ENCOUNTER — Emergency Department
Admission: EM | Admit: 2024-06-13 | Discharge: 2024-06-13 | Discharge status: Against Medical Advice | Attending: Emergency Medicine | Admitting: Emergency Medicine

## 2024-06-13 DIAGNOSIS — R220 Localized swelling, mass and lump, head: Secondary | ICD-10-CM | POA: Insufficient documentation

## 2024-06-13 DIAGNOSIS — Z5321 Procedure and treatment not carried out due to patient leaving prior to being seen by health care provider: Secondary | ICD-10-CM | POA: Diagnosis not present

## 2024-06-13 NOTE — ED Triage Notes (Signed)
 Patient to ED via POV for left eye swelling. Started this AM after get a tattoo above eye yesterday.

## 2024-09-20 ENCOUNTER — Ambulatory Visit
